# Patient Record
Sex: Male | Born: 1960 | Race: White | Hispanic: No | Marital: Married | Smoking: Never smoker
Health system: Southern US, Community
[De-identification: ages and names within clinical notes are randomized; demographics above are authoritative.]

## PROBLEM LIST (undated history)

## (undated) DIAGNOSIS — E785 Hyperlipidemia, unspecified: Secondary | ICD-10-CM

## (undated) DIAGNOSIS — K219 Gastro-esophageal reflux disease without esophagitis: Secondary | ICD-10-CM

## (undated) DIAGNOSIS — J301 Allergic rhinitis due to pollen: Secondary | ICD-10-CM

## (undated) DIAGNOSIS — N200 Calculus of kidney: Secondary | ICD-10-CM

## (undated) DIAGNOSIS — K589 Irritable bowel syndrome without diarrhea: Secondary | ICD-10-CM

## (undated) DIAGNOSIS — G43909 Migraine, unspecified, not intractable, without status migrainosus: Secondary | ICD-10-CM

## (undated) DIAGNOSIS — Z9289 Personal history of other medical treatment: Secondary | ICD-10-CM

## (undated) DIAGNOSIS — B019 Varicella without complication: Secondary | ICD-10-CM

## (undated) DIAGNOSIS — Z889 Allergy status to unspecified drugs, medicaments and biological substances status: Secondary | ICD-10-CM

## (undated) DIAGNOSIS — K5792 Diverticulitis of intestine, part unspecified, without perforation or abscess without bleeding: Secondary | ICD-10-CM

## (undated) DIAGNOSIS — I10 Essential (primary) hypertension: Secondary | ICD-10-CM

## (undated) HISTORY — DX: Varicella without complication: B01.9

## (undated) HISTORY — DX: Diverticulitis of intestine, part unspecified, without perforation or abscess without bleeding: K57.92

## (undated) HISTORY — DX: Allergic rhinitis due to pollen: J30.1

## (undated) HISTORY — DX: Allergy status to unspecified drugs, medicaments and biological substances: Z88.9

## (undated) HISTORY — DX: Personal history of other medical treatment: Z92.89

## (undated) HISTORY — DX: Migraine, unspecified, not intractable, without status migrainosus: G43.909

## (undated) HISTORY — DX: Hyperlipidemia, unspecified: E78.5

## (undated) HISTORY — DX: Gastro-esophageal reflux disease without esophagitis: K21.9

## (undated) HISTORY — DX: Calculus of kidney: N20.0

## (undated) HISTORY — PX: KIDNEY STONE SURGERY: SHX686

## (undated) HISTORY — DX: Essential (primary) hypertension: I10

## (undated) HISTORY — DX: Irritable bowel syndrome, unspecified: K58.9

---

## 2005-02-24 HISTORY — PX: SKIN GRAFT: SHX250

## 2005-02-24 HISTORY — PX: OTHER SURGICAL HISTORY: SHX169

## 2005-02-24 HISTORY — PX: ROTATOR CUFF REPAIR: SHX139

## 2010-04-05 ENCOUNTER — Ambulatory Visit (HOSPITAL_COMMUNITY)
Admission: RE | Admit: 2010-04-05 | Discharge: 2010-04-05 | Disposition: A | Discharge status: Home / Self Care | Payer: Self-pay | Source: Ambulatory Visit | Attending: Family Medicine | Admitting: Family Medicine

## 2010-04-05 ENCOUNTER — Other Ambulatory Visit (HOSPITAL_COMMUNITY): Payer: Self-pay | Admitting: Family Medicine

## 2010-04-05 DIAGNOSIS — M545 Low back pain, unspecified: Secondary | ICD-10-CM | POA: Insufficient documentation

## 2010-04-05 DIAGNOSIS — R52 Pain, unspecified: Secondary | ICD-10-CM

## 2010-04-05 DIAGNOSIS — M79609 Pain in unspecified limb: Secondary | ICD-10-CM | POA: Insufficient documentation

## 2013-03-07 ENCOUNTER — Ambulatory Visit (INDEPENDENT_AMBULATORY_CARE_PROVIDER_SITE_OTHER): Payer: Commercial Managed Care - PPO | Admitting: Physician Assistant

## 2013-03-07 ENCOUNTER — Encounter: Payer: Self-pay | Admitting: Physician Assistant

## 2013-03-07 VITALS — BP 110/74 | HR 98 | Temp 98.5°F | Resp 16 | Ht 67.5 in | Wt 185.0 lb

## 2013-03-07 DIAGNOSIS — K219 Gastro-esophageal reflux disease without esophagitis: Secondary | ICD-10-CM

## 2013-03-07 DIAGNOSIS — Z136 Encounter for screening for cardiovascular disorders: Secondary | ICD-10-CM

## 2013-03-07 DIAGNOSIS — R1013 Epigastric pain: Secondary | ICD-10-CM

## 2013-03-07 DIAGNOSIS — Z0000 Encounter for general adult medical examination without abnormal findings: Secondary | ICD-10-CM

## 2013-03-07 DIAGNOSIS — I10 Essential (primary) hypertension: Secondary | ICD-10-CM

## 2013-03-07 DIAGNOSIS — E785 Hyperlipidemia, unspecified: Secondary | ICD-10-CM

## 2013-03-07 MED ORDER — LISINOPRIL-HYDROCHLOROTHIAZIDE 20-12.5 MG PO TABS: 1.0000 | ORAL_TABLET | Freq: Every day | ORAL | Status: DC

## 2013-03-07 MED ORDER — PANTOPRAZOLE SODIUM 40 MG PO TBEC: 40.0000 mg | DELAYED_RELEASE_TABLET | Freq: Every day | ORAL | Status: DC

## 2013-03-07 MED ORDER — LOVASTATIN 20 MG PO TABS: 20.0000 mg | ORAL_TABLET | Freq: Every day | ORAL | Status: DC

## 2013-03-07 MED ORDER — PROPRANOLOL HCL 20 MG PO TABS: 20.0000 mg | ORAL_TABLET | Freq: Two times a day (BID) | ORAL | Status: DC

## 2013-03-07 NOTE — Progress Notes (Signed)
Patient presents to clinic today to establish care.  Acute Concerns: Patient needs refills of medications  Chronic Issues: Hypertension -- well-controlled with Lisinopril-HCTZ and propanolol.  Will need baseline EKG.  Does endorse a gradual worsening of far vision.  Denies acute vision change, palpitations, chest pain, shortness of breath, lightheadedness or dizziness.  Hyperlipidemia -- Currently on Pravachol.  Wishes to switch to a different statin due to insurance change and cost.  GERD -- Currently on Protonix daily.  Tries to watch his food intake. Endorses breakthrough symptoms, even with PPI therapy.  Will need referral to GI for further workup.  Hx of Ocular migraines -- infrequent.  Patient with symptom improvement since starting propanolol for HTN.  Health Maintenance: Dental -- UTD Vision -- Overdue Immunizations -- Declines flu shot.  Endorses being UTD for Tetanus. Colonoscopy -- Overdue.  Will need referral to GI.  Past Medical History  Diagnosis Date  . Chicken pox   . Diverticulitis   . GERD (gastroesophageal reflux disease)   . Hay fever   . H/O seasonal allergies   . Hypertension   . Hyperlipidemia   . Kidney stones   . Migraines   . History of blood transfusion     Past Surgical History  Procedure Laterality Date  . Degloving of arm  2007    Right [Post Injury]  . Skin graft  2007    Right Arm Axillary  . Rotator cuff repair  2007    Right    No current outpatient prescriptions on file prior to visit.   No current facility-administered medications on file prior to visit.    Allergies  Allergen Reactions  . Erythromycin Nausea And Vomiting  . Penicillins Other (See Comments)    Subtherapeutic  . Tramadol Nausea And Vomiting    Family History  Problem Relation Age of Onset  . Breast cancer Mother 5256    Deceased  . Heart disease Father 7370    Deceased  . Stroke Father   . Hypertension Father   . Diabetes Father   . Parkinson's disease  Paternal Grandfather   . Healthy Brother     x1  . Healthy Sister     x1    History   Social History  . Marital Status: Married    Spouse Name: N/A    Number of Children: N/A  . Years of Education: N/A   Occupational History  . Not on file.   Social History Main Topics  . Smoking status: Never Smoker   . Smokeless tobacco: Not on file  . Alcohol Use: No  . Drug Use: No  . Sexual Activity: Yes    Partners: Female   Other Topics Concern  . Not on file   Social History Narrative  . No narrative on file    Review of Systems  Constitutional: Negative for fever and weight loss.  HENT: Negative for ear pain, hearing loss and tinnitus.   Eyes: Positive for blurred vision. Negative for double vision, photophobia and pain.  Respiratory: Negative for cough, shortness of breath and wheezing.   Cardiovascular: Positive for palpitations. Negative for chest pain.  Gastrointestinal: Positive for heartburn and abdominal pain. Negative for nausea, vomiting, diarrhea, constipation, blood in stool and melena.  Genitourinary: Negative for dysuria, urgency, frequency, hematuria and flank pain.       Nocturia x 0  Musculoskeletal: Positive for joint pain.  Neurological: Positive for headaches. Negative for dizziness, seizures and loss of consciousness.  Endo/Heme/Allergies: Positive for  environmental allergies.  Psychiatric/Behavioral: Negative for depression, suicidal ideas, hallucinations and substance abuse. The patient is not nervous/anxious and does not have insomnia.    BP 110/74  Pulse 98  Temp(Src) 98.5 F (36.9 C) (Oral)  Resp 16  Ht 5' 7.5" (1.715 m)  Wt 185 lb (83.915 kg)  BMI 28.53 kg/m2  SpO2 98%  Physical Exam  Vitals reviewed. Constitutional: He is oriented to person, place, and time and well-developed, well-nourished, and in no distress.  HENT:  Head: Normocephalic and atraumatic.  Right Ear: External ear normal.  Left Ear: External ear normal.  Nose: Nose  normal.  Mouth/Throat: Oropharynx is clear and moist. No oropharyngeal exudate.  TM within normal limits bilaterally.  Eyes: Conjunctivae and EOM are normal. Pupils are equal, round, and reactive to light.  Neck: Neck supple. No thyromegaly present.  Cardiovascular: Normal rate, regular rhythm, normal heart sounds and intact distal pulses.   Pulmonary/Chest: Effort normal and breath sounds normal. No respiratory distress. He has no wheezes. He has no rales. He exhibits no tenderness.  Abdominal: Soft. Bowel sounds are normal. He exhibits no distension and no mass. There is no tenderness. There is no rebound and no guarding. No hernia.  Genitourinary: Testes/scrotum normal and penis normal. No discharge found.  Lymphadenopathy:    He has no cervical adenopathy.  Neurological: He is alert and oriented to person, place, and time. No cranial nerve deficit.  Skin: Skin is warm and dry. No rash noted.  Psychiatric: Affect normal.   Assessment/Plan: No problem-specific assessment & plan notes found for this encounter.

## 2013-03-07 NOTE — Progress Notes (Signed)
Pre visit review using our clinic review tool, if applicable. No additional management support is needed unless otherwise documented below in the visit note/SLS  

## 2013-03-07 NOTE — Patient Instructions (Signed)
Please take medications as prescribed.  Please return for labs. I will call you with your results.  You will be contacted by a Gastroenterology office for an appointment date/time.

## 2013-03-08 ENCOUNTER — Telehealth: Payer: Self-pay | Admitting: Physician Assistant

## 2013-03-08 DIAGNOSIS — Z0000 Encounter for general adult medical examination without abnormal findings: Secondary | ICD-10-CM | POA: Insufficient documentation

## 2013-03-08 DIAGNOSIS — I10 Essential (primary) hypertension: Secondary | ICD-10-CM | POA: Insufficient documentation

## 2013-03-08 DIAGNOSIS — K219 Gastro-esophageal reflux disease without esophagitis: Secondary | ICD-10-CM | POA: Insufficient documentation

## 2013-03-08 DIAGNOSIS — E785 Hyperlipidemia, unspecified: Secondary | ICD-10-CM | POA: Insufficient documentation

## 2013-03-08 DIAGNOSIS — R1013 Epigastric pain: Secondary | ICD-10-CM | POA: Insufficient documentation

## 2013-03-08 LAB — CBC WITH DIFFERENTIAL/PLATELET
Basophils Absolute: 0.1 10*3/uL (ref 0.0–0.1)
Basophils Relative: 1 % (ref 0–1)
Eosinophils Absolute: 0.2 10*3/uL (ref 0.0–0.7)
Eosinophils Relative: 4 % (ref 0–5)
HCT: 42.5 % (ref 39.0–52.0)
HEMOGLOBIN: 14.6 g/dL (ref 13.0–17.0)
LYMPHS ABS: 1.6 10*3/uL (ref 0.7–4.0)
Lymphocytes Relative: 28 % (ref 12–46)
MCH: 30.9 pg (ref 26.0–34.0)
MCHC: 34.4 g/dL (ref 30.0–36.0)
MCV: 90 fL (ref 78.0–100.0)
MONOS PCT: 9 % (ref 3–12)
Monocytes Absolute: 0.5 10*3/uL (ref 0.1–1.0)
NEUTROS ABS: 3.4 10*3/uL (ref 1.7–7.7)
NEUTROS PCT: 58 % (ref 43–77)
PLATELETS: 323 10*3/uL (ref 150–400)
RBC: 4.72 MIL/uL (ref 4.22–5.81)
RDW: 13.4 % (ref 11.5–15.5)
WBC: 5.7 10*3/uL (ref 4.0–10.5)

## 2013-03-08 LAB — LIPID PANEL
CHOL/HDL RATIO: 3.3 ratio
CHOLESTEROL: 174 mg/dL (ref 0–200)
HDL: 52 mg/dL (ref >39)
LDL Cholesterol: 105 mg/dL — ABNORMAL HIGH (ref 0–99)
Triglycerides: 87 mg/dL (ref <150)
VLDL: 17 mg/dL (ref 0–40)

## 2013-03-08 LAB — BASIC METABOLIC PANEL
BUN: 17 mg/dL (ref 6–23)
CO2: 28 meq/L (ref 19–32)
Calcium: 9.2 mg/dL (ref 8.4–10.5)
Chloride: 105 mEq/L (ref 96–112)
Creat: 0.94 mg/dL (ref 0.50–1.35)
GLUCOSE: 101 mg/dL — AB (ref 70–99)
POTASSIUM: 4.2 meq/L (ref 3.5–5.3)
SODIUM: 141 meq/L (ref 135–145)

## 2013-03-08 LAB — HEPATIC FUNCTION PANEL
ALBUMIN: 4.1 g/dL (ref 3.5–5.2)
ALK PHOS: 78 U/L (ref 39–117)
ALT: 25 U/L (ref 0–53)
AST: 21 U/L (ref 0–37)
BILIRUBIN INDIRECT: 0.6 mg/dL (ref 0.0–0.9)
BILIRUBIN TOTAL: 0.7 mg/dL (ref 0.3–1.2)
Bilirubin, Direct: 0.1 mg/dL (ref 0.0–0.3)
TOTAL PROTEIN: 6.5 g/dL (ref 6.0–8.3)

## 2013-03-08 LAB — LIPASE: Lipase: 47 U/L (ref 0–75)

## 2013-03-08 NOTE — Assessment & Plan Note (Signed)
History reviewed.  Health maintenance reviewed.  Declines flu shot.  Will obtain fasting labs.  Will obtain records from previous PCP.

## 2013-03-08 NOTE — Assessment & Plan Note (Signed)
Asymptomatic.  Baseline EKG with NSR.  Medications refilled.

## 2013-03-08 NOTE — Assessment & Plan Note (Signed)
Will obtain fasting lipid profile.  Will switch to Lovastatin for insurance purposes.

## 2013-03-08 NOTE — Assessment & Plan Note (Signed)
Refill Protonix.  Referral to GI due to chronic epigastric pain and refractory reflux symptoms.  Will obtain CMP and lipase.

## 2013-03-08 NOTE — Telephone Encounter (Signed)
Relevant patient education assigned to patient using Emmi. ° °

## 2013-03-09 LAB — URINALYSIS, ROUTINE W REFLEX MICROSCOPIC
Bilirubin Urine: NEGATIVE
GLUCOSE, UA: NEGATIVE mg/dL
KETONES UR: NEGATIVE mg/dL
Leukocytes, UA: NEGATIVE
NITRITE: NEGATIVE
PH: 6 (ref 5.0–8.0)
Protein, ur: NEGATIVE mg/dL
Urobilinogen, UA: 0.2 mg/dL (ref 0.0–1.0)

## 2013-03-09 LAB — URINALYSIS, MICROSCOPIC ONLY
BACTERIA UA: NONE SEEN
CASTS: NONE SEEN
CRYSTALS: NONE SEEN
Squamous Epithelial / LPF: NONE SEEN

## 2013-03-09 LAB — HEMOGLOBIN A1C
HEMOGLOBIN A1C: 5.7 % — AB (ref <5.7)
MEAN PLASMA GLUCOSE: 117 mg/dL — AB (ref <117)

## 2013-03-09 LAB — PSA: PSA: 0.53 ng/mL (ref <=4.00)

## 2013-03-09 LAB — TSH: TSH: 0.833 u[IU]/mL (ref 0.350–4.500)

## 2013-04-01 ENCOUNTER — Encounter: Payer: Self-pay | Admitting: Gastroenterology

## 2013-04-07 ENCOUNTER — Telehealth: Payer: Self-pay | Admitting: Physician Assistant

## 2013-04-07 NOTE — Telephone Encounter (Signed)
lovastatin (MEVACOR) 20 MG tablet 90 tablet 0 03/07/2013     Sig - Route: Take 1 tablet (20 mg total) by mouth at bedtime. - Oral    E-Prescribing Status: Receipt confirmed by pharmacy (03/07/2013 3:35 PM EST)    Associated Diagnoses    Hyperlipidemia    Pharmacy    [id number] - [place], [place] - [address]   TOO SOON FOR REFILL/sls

## 2013-04-07 NOTE — Telephone Encounter (Signed)
Lovastatin 21 mg  This was denied stating we did not have a patient by this name

## 2013-04-22 ENCOUNTER — Encounter: Payer: Self-pay | Admitting: Physician Assistant

## 2013-04-22 ENCOUNTER — Ambulatory Visit (INDEPENDENT_AMBULATORY_CARE_PROVIDER_SITE_OTHER): Payer: Commercial Managed Care - PPO | Admitting: Physician Assistant

## 2013-04-22 VITALS — BP 112/75 | HR 101 | Temp 99.2°F | Resp 16 | Ht 67.5 in | Wt 183.2 lb

## 2013-04-22 DIAGNOSIS — R059 Cough, unspecified: Secondary | ICD-10-CM

## 2013-04-22 DIAGNOSIS — J069 Acute upper respiratory infection, unspecified: Secondary | ICD-10-CM

## 2013-04-22 DIAGNOSIS — B9789 Other viral agents as the cause of diseases classified elsewhere: Principal | ICD-10-CM

## 2013-04-22 DIAGNOSIS — R509 Fever, unspecified: Secondary | ICD-10-CM

## 2013-04-22 DIAGNOSIS — J029 Acute pharyngitis, unspecified: Secondary | ICD-10-CM

## 2013-04-22 DIAGNOSIS — R05 Cough: Secondary | ICD-10-CM

## 2013-04-22 LAB — POCT RAPID STREP A (OFFICE): Rapid Strep A Screen: NEGATIVE

## 2013-04-22 MED ORDER — ONDANSETRON 8 MG PO TBDP: 8.0000 mg | ORAL_TABLET | Freq: Three times a day (TID) | ORAL | Status: DC | PRN

## 2013-04-22 MED ORDER — BENZONATATE 200 MG PO CAPS: 200.0000 mg | ORAL_CAPSULE | Freq: Three times a day (TID) | ORAL | Status: DC | PRN

## 2013-04-22 NOTE — Assessment & Plan Note (Signed)
Rapid strep negative.   Increase fluid intake.  Rest.  Saline nasal spray. Mucinex. Humidifier in bedroom. Tessalon Perles for cough.  Rx. zofran for nausea.  Please call or return to clinic if symptoms are not improving.

## 2013-04-22 NOTE — Addendum Note (Signed)
Addended by: Regis BillSCATES, SHARON L on: 04/22/2013 04:41 PM   Modules accepted: Orders

## 2013-04-22 NOTE — Patient Instructions (Signed)
Increase fluid intake.  Rest.  Saline nasal spray. Use plain Mucinex. Take Tessalon Perles as directed for cough. Place a humidifier in bedroom. Tylenol as needed for fever.  Please call or return to clinic if symptoms are not improved by Monday.    Viral Infections A virus is a type of germ. Viruses can cause:  Minor sore throats.  Aches and pains.  Headaches.  Runny nose.  Rashes.  Watery eyes.  Tiredness.  Coughs.  Loss of appetite.  Feeling sick to your stomach (nausea).  Throwing up (vomiting).  Watery poop (diarrhea). HOME CARE   Only take medicines as told by your doctor.  Drink enough water and fluids to keep your pee (urine) clear or pale yellow. Sports drinks are a good choice.  Get plenty of rest and eat healthy. Soups and broths with crackers or rice are fine. GET HELP RIGHT AWAY IF:   You have a very bad headache.  You have shortness of breath.  You have chest pain or neck pain.  You have an unusual rash.  You cannot stop throwing up.  You have watery poop that does not stop.  You cannot keep fluids down.  You or your child has a temperature by mouth above 102 F (38.9 C), not controlled by medicine.  Your baby is older than 3 months with a rectal temperature of 102 F (38.9 C) or higher.  Your baby is 133 months old or younger with a rectal temperature of 100.4 F (38 C) or higher. MAKE SURE YOU:   Understand these instructions.  Will watch this condition.  Will get help right away if you are not doing well or get worse. Document Released: 01/24/2008 Document Revised: 05/05/2011 Document Reviewed: 06/18/2010 [facility] Patient Information 2014 [place], [place].

## 2013-04-22 NOTE — Progress Notes (Signed)
Patient presents to clinic today c/o 2 days of nasal congestion, chest congestion and mild cough.  Patient endorses gradual onset of symptoms.  Patient states cough is productive of yellow sputum.  Endorses fever that began late last night. Fever ~ 101, reduced with Tylenol. Temperature is 99.2 in clinic today.  Has not taken Tylenol this am. Patient denies sinus pressure, sinus pain, ear pain, tooth pain, shortness of breath or wheezing.  Denies aches.  Denies recent travel or sick contact.  Denies history of asthma or allergy.  Cough is keeping patient up at night and is causing fatigue.  Denies pleuritic chest pain. O2 saturation at 100% on RA.  Past Medical History  Diagnosis Date  . Chicken pox   . Diverticulitis   . GERD (gastroesophageal reflux disease)   . Hay fever   . H/O seasonal allergies   . Hypertension   . Hyperlipidemia   . Kidney stones   . Migraines   . History of blood transfusion     Current Outpatient Prescriptions on File Prior to Visit  Medication Sig Dispense Refill  . lisinopril-hydrochlorothiazide (PRINZIDE,ZESTORETIC) 20-12.5 MG per tablet Take 1 tablet by mouth daily.  30 tablet  2  . lovastatin (MEVACOR) 20 MG tablet Take 1 tablet (20 mg total) by mouth at bedtime.  90 tablet  0  . pantoprazole (PROTONIX) 40 MG tablet Take 1 tablet (40 mg total) by mouth daily.  30 tablet  2  . propranolol (INDERAL) 20 MG tablet Take 1 tablet (20 mg total) by mouth 2 (two) times daily.  60 tablet  2   No current facility-administered medications on file prior to visit.    Allergies  Allergen Reactions  . Erythromycin Nausea And Vomiting  . Penicillins Other (See Comments)    Subtherapeutic  . Tramadol Nausea And Vomiting    Family History  Problem Relation Age of Onset  . Breast cancer Mother 3    Deceased  . Heart disease Father 20    Deceased  . Stroke Father   . Hypertension Father   . Diabetes Father   . Parkinson's disease Paternal Grandfather   . Healthy  Brother     x1  . Healthy Sister     x1    History   Social History  . Marital Status: Married    Spouse Name: N/A    Number of Children: N/A  . Years of Education: N/A   Social History Main Topics  . Smoking status: Never Smoker   . Smokeless tobacco: None  . Alcohol Use: No  . Drug Use: No  . Sexual Activity: Yes    Partners: Female   Other Topics Concern  . None   Social History Narrative  . None   Review of Systems - See HPI.  All other ROS are negative.  BP 112/75  Pulse 101  Temp(Src) 99.2 F (37.3 C) (Oral)  Resp 16  Ht 5' 7.5" (1.715 m)  Wt 183 lb 4 oz (83.122 kg)  BMI 28.26 kg/m2  SpO2 100%  Physical Exam  Vitals reviewed. Constitutional: He is oriented to person, place, and time and well-developed, well-nourished, and in no distress.  HENT:  Head: Normocephalic and atraumatic.  Right Ear: Tympanic membrane, external ear and ear canal normal.  Left Ear: Tympanic membrane, external ear and ear canal normal.  Nose: Nose normal.  Mouth/Throat: Uvula is midline and mucous membranes are normal. No uvula swelling. Posterior oropharyngeal erythema present. No oropharyngeal exudate, posterior oropharyngeal  edema or tonsillar abscesses.  Left tonsil 2+ without exudate .  Eyes: Conjunctivae are normal. Pupils are equal, round, and reactive to light.  Neck: Neck supple.  Cardiovascular: Regular rhythm, normal heart sounds and intact distal pulses.   Mildly tachycardic. Patient has not taken Inderal yet this am.  Pulmonary/Chest: Effort normal and breath sounds normal. No respiratory distress. He has no wheezes. He has no rales. He exhibits no tenderness.  Lymphadenopathy:    He has no cervical adenopathy.  Neurological: He is alert and oriented to person, place, and time.  Skin: Skin is warm. No rash noted.  Psychiatric: Affect normal.    Recent Results (from the past 2160 hour(s))  CBC WITH DIFFERENTIAL     Status: None   Collection Time    03/07/13  10:53 AM      Result Value Ref Range   WBC 5.7  4.0 - 10.5 K/uL   RBC 4.72  4.22 - 5.81 MIL/uL   Hemoglobin 14.6  13.0 - 17.0 g/dL   HCT 42.5  39.0 - 52.0 %   MCV 90.0  78.0 - 100.0 fL   MCH 30.9  26.0 - 34.0 pg   MCHC 34.4  30.0 - 36.0 g/dL   RDW 13.4  11.5 - 15.5 %   Platelets 323  150 - 400 K/uL   Neutrophils Relative % 58  43 - 77 %   Neutro Abs 3.4  1.7 - 7.7 K/uL   Lymphocytes Relative 28  12 - 46 %   Lymphs Abs 1.6  0.7 - 4.0 K/uL   Monocytes Relative 9  3 - 12 %   Monocytes Absolute 0.5  0.1 - 1.0 K/uL   Eosinophils Relative 4  0 - 5 %   Eosinophils Absolute 0.2  0.0 - 0.7 K/uL   Basophils Relative 1  0 - 1 %   Basophils Absolute 0.1  0.0 - 0.1 K/uL   Smear Review Criteria for review not met    BASIC METABOLIC PANEL     Status: Abnormal   Collection Time    03/07/13 10:53 AM      Result Value Ref Range   Sodium 141  135 - 145 mEq/L   Potassium 4.2  3.5 - 5.3 mEq/L   Chloride 105  96 - 112 mEq/L   CO2 28  19 - 32 mEq/L   Glucose, Bld 101 (*) 70 - 99 mg/dL   BUN 17  6 - 23 mg/dL   Creat 0.94  0.50 - 1.35 mg/dL   Calcium 9.2  8.4 - 10.5 mg/dL  HEPATIC FUNCTION PANEL     Status: None   Collection Time    03/07/13 10:53 AM      Result Value Ref Range   Total Bilirubin 0.7  0.3 - 1.2 mg/dL   Bilirubin, Direct 0.1  0.0 - 0.3 mg/dL   Indirect Bilirubin 0.6  0.0 - 0.9 mg/dL   Alkaline Phosphatase 78  39 - 117 U/L   AST 21  0 - 37 U/L   ALT 25  0 - 53 U/L   Total Protein 6.5  6.0 - 8.3 g/dL   Albumin 4.1  3.5 - 5.2 g/dL  TSH     Status: None   Collection Time    03/07/13 10:53 AM      Result Value Ref Range   TSH 0.833  0.350 - 4.500 uIU/mL  HEMOGLOBIN A1C     Status: Abnormal   Collection Time  03/07/13 10:53 AM      Result Value Ref Range   Hemoglobin A1C 5.7 (*) <5.7 %   Comment:                                                                            According to the ADA Clinical Practice Recommendations for 2011, when     HbA1c is used as a screening  test:             >=6.5%   Diagnostic of Diabetes Mellitus                (if abnormal result is confirmed)           5.7-6.4%   Increased risk of developing Diabetes Mellitus           References:Diagnosis and Classification of Diabetes Mellitus,Diabetes     ONGE,9528,41(LKGMW 1):S62-S69 and Standards of Medical Care in             Diabetes - 2011,Diabetes Care,2011,34 (Suppl 1):S11-S61.         Mean Plasma Glucose 117 (*) <117 mg/dL  LIPID PANEL     Status: Abnormal   Collection Time    03/07/13 10:53 AM      Result Value Ref Range   Cholesterol 174  0 - 200 mg/dL   Comment: ATP III Classification:           < 200        mg/dL        Desirable          200 - 239     mg/dL        Borderline High          >= 240        mg/dL        High         Triglycerides 87  <150 mg/dL   HDL 52  >39 mg/dL   Total CHOL/HDL Ratio 3.3     VLDL 17  0 - 40 mg/dL   LDL Cholesterol 105 (*) 0 - 99 mg/dL   Comment:       Total Cholesterol/HDL Ratio:CHD Risk                            Coronary Heart Disease Risk Table                                            Men       Women              1/2 Average Risk              3.4        3.3                  Average Risk              5.0        4.4               2X  Average Risk              9.6        7.1               3X Average Risk             23.4       11.0     Use the calculated Patient Ratio above and the CHD Risk table      to determine the patient's CHD Risk.     ATP III Classification (LDL):           < 100        mg/dL         Optimal          100 - 129     mg/dL         Near or Above Optimal          130 - 159     mg/dL         Borderline High          160 - 189     mg/dL         High           > 190        mg/dL         Very High        PSA     Status: None   Collection Time    03/07/13 10:53 AM      Result Value Ref Range   PSA 0.53  <=4.00 ng/mL   Comment: Test Methodology: ECLIA PSA (Electrochemiluminescence Immunoassay)            For PSA values from 2.5-4.0, particularly in younger men <60 years     old, the AUA and NCCN suggest testing for % Free PSA (3515) and     evaluation of the rate of increase in PSA (PSA velocity).  URINALYSIS, ROUTINE W REFLEX MICROSCOPIC     Status: Abnormal   Collection Time    03/07/13 10:53 AM      Result Value Ref Range   Color, Urine YELLOW  YELLOW   APPearance CLEAR  CLEAR   Specific Gravity, Urine <1.005 (*) 1.005 - 1.030   pH 6.0  5.0 - 8.0   Glucose, UA NEG  NEG mg/dL   Bilirubin Urine NEG  NEG   Ketones, ur NEG  NEG mg/dL   Hgb urine dipstick SMALL (*) NEG   Protein, ur NEG  NEG mg/dL   Urobilinogen, UA 0.2  0.0 - 1.0 mg/dL   Nitrite NEG  NEG   Leukocytes, UA NEG  NEG  LIPASE     Status: None   Collection Time    03/07/13 10:53 AM      Result Value Ref Range   Lipase 47  0 - 75 U/L  URINALYSIS, MICROSCOPIC ONLY     Status: None   Collection Time    03/07/13 10:53 AM      Result Value Ref Range   Squamous Epithelial / LPF NONE SEEN  RARE   Crystals NONE SEEN  NONE SEEN   Casts NONE SEEN  NONE SEEN   WBC, UA 0-2  <3 WBC/hpf   RBC / HPF 0-2  <3 RBC/hpf   Bacteria, UA NONE SEEN  RARE    Assessment/Plan: Viral URI with cough Rapid strep negative.   Increase fluid intake.  Rest.  Saline nasal spray.  Mucinex. Humidifier in bedroom. Tessalon Perles for cough.  Rx. zofran for nausea.  Please call or return to clinic if symptoms are not improving.

## 2013-04-22 NOTE — Progress Notes (Signed)
Pre visit review using our clinic review tool, if applicable. No additional management support is needed unless otherwise documented below in the visit note/SLS  

## 2013-05-03 ENCOUNTER — Ambulatory Visit (INDEPENDENT_AMBULATORY_CARE_PROVIDER_SITE_OTHER): Payer: Commercial Managed Care - PPO | Admitting: Gastroenterology

## 2013-05-03 ENCOUNTER — Encounter: Payer: Self-pay | Admitting: Gastroenterology

## 2013-05-03 VITALS — BP 90/60 | HR 96 | Ht 67.75 in | Wt 180.0 lb

## 2013-05-03 DIAGNOSIS — Z1211 Encounter for screening for malignant neoplasm of colon: Secondary | ICD-10-CM | POA: Insufficient documentation

## 2013-05-03 DIAGNOSIS — K219 Gastro-esophageal reflux disease without esophagitis: Secondary | ICD-10-CM

## 2013-05-03 NOTE — Progress Notes (Signed)
_                                                                                                                History of Present Illness: Pleasant 53 year old white male referred to establish care for chronic reflux.  He has had reflux for at least 25 years for which he has taken PPIs with good control.  He's currently taking Protonix.   He denies nausea, dysphagia or pyrosis.  He underwent a barium enema sometime in the 1990s.  He has no lower GI complaints including change of bowel habits, abdominal pain, melena or hematochezia.    Past Medical History  Diagnosis Date  . Chicken pox   . Diverticulitis   . GERD (gastroesophageal reflux disease)   . Hay fever   . H/O seasonal allergies   . Hypertension   . Hyperlipidemia   . Kidney stones   . Migraines   . History of blood transfusion   . IBS (irritable bowel syndrome)    Past Surgical History  Procedure Laterality Date  . Degloving of arm Right 2007     [Post Injury]  . Skin graft Right 2007    Arm Axillary  . Rotator cuff repair Right 2007  . Kidney stone surgery     family history includes Breast cancer (age of onset: 756) in his mother; Diabetes in his father; Healthy in his brother and sister; Heart disease (age of onset: 5670) in his father; Hypertension in his father; Irritable bowel syndrome in his brother and father; Other in his brother; Parkinson's disease in his paternal grandfather; Stroke in his father. Current Outpatient Prescriptions  Medication Sig Dispense Refill  . lisinopril-hydrochlorothiazide (PRINZIDE,ZESTORETIC) 20-12.5 MG per tablet Take 1 tablet by mouth daily.  30 tablet  2  . lovastatin (MEVACOR) 20 MG tablet Take 1 tablet (20 mg total) by mouth at bedtime.  90 tablet  0  . pantoprazole (PROTONIX) 40 MG tablet Take 1 tablet (40 mg total) by mouth daily.  30 tablet  2  . propranolol (INDERAL) 20 MG tablet Take 1 tablet (20 mg total) by mouth 2 (two) times daily.  60 tablet  2   No  current facility-administered medications for this visit.   Allergies as of 05/03/2013 - Review Complete 05/03/2013  Allergen Reaction Noted  . Erythromycin Nausea And Vomiting 03/07/2013  . Penicillins Other (See Comments) 03/07/2013  . Tramadol Nausea And Vomiting 03/07/2013    reports that he has never smoked. He has never used smokeless tobacco. He reports that he does not drink alcohol or use illicit drugs.     Review of Systems: Pertinent positive and negative review of systems were noted in the above HPI section. All other review of systems were otherwise negative.  Vital signs were reviewed in today's medical record Physical Exam: General: Well developed , well nourished, no acute distress Skin: anicteric Head: Normocephalic and atraumatic Eyes:  sclerae anicteric, EOMI Ears: Normal auditory acuity Mouth: No deformity or lesions Neck: Supple, no masses  or thyromegaly Lungs: Clear throughout to auscultation Heart: Regular rate and rhythm; no murmurs, rubs or bruits Abdomen: Soft, non tender and non distended. No masses, hepatosplenomegaly or hernias noted. Normal Bowel sounds Rectal:deferred Musculoskeletal: Symmetrical with no gross deformities  Skin: No lesions on visible extremities Pulses:  Normal pulses noted Extremities: No clubbing, cyanosis, edema or deformities noted Neurological: Alert oriented x 4, grossly nonfocal Cervical Nodes:  No significant cervical adenopathy Inguinal Nodes: No significant inguinal adenopathy Psychological:  Alert and cooperative. Normal mood and affect  See Assessment and Plan under Problem List

## 2013-05-03 NOTE — Assessment & Plan Note (Signed)
Patient has PPI--dependent GERD.  Since he's had symptoms and has been on therapy for over 25 years I think Barrett's esophagus should be ruled out.    Recommendations #1 upper endoscopy #2 continue Protonix

## 2013-05-03 NOTE — Patient Instructions (Signed)
It has been recommended to you by your physician that you have a(n) endoscopy and colonoscopy completed. Per your request, we did not schedule the procedure(s) today. Please contact our office at [phone] should you decide to have the procedure completed.

## 2013-05-03 NOTE — Assessment & Plan Note (Signed)
Recommend screening colonoscopy

## 2013-06-07 ENCOUNTER — Telehealth: Payer: Self-pay | Admitting: Physician Assistant

## 2013-06-07 DIAGNOSIS — I10 Essential (primary) hypertension: Secondary | ICD-10-CM

## 2013-06-07 DIAGNOSIS — E785 Hyperlipidemia, unspecified: Secondary | ICD-10-CM

## 2013-06-07 NOTE — Telephone Encounter (Signed)
Lovastatin 20 mg qty 90  Lisinopril 20/12.5 wants 90

## 2013-06-08 MED ORDER — LOVASTATIN 20 MG PO TABS: 20.0000 mg | ORAL_TABLET | Freq: Every day | ORAL | Status: DC

## 2013-06-08 MED ORDER — LISINOPRIL-HYDROCHLOROTHIAZIDE 20-12.5 MG PO TABS: 1.0000 | ORAL_TABLET | Freq: Every day | ORAL | Status: DC

## 2013-06-08 NOTE — Telephone Encounter (Signed)
Rx request to pharmacy/SLS  

## 2013-06-08 NOTE — Addendum Note (Signed)
Addended by: Regis BillSCATES, SHARON L on: 06/08/2013 11:27 AM   Modules accepted: Orders

## 2013-08-31 ENCOUNTER — Telehealth: Payer: Self-pay | Admitting: Physician Assistant

## 2013-08-31 DIAGNOSIS — I10 Essential (primary) hypertension: Secondary | ICD-10-CM

## 2013-08-31 DIAGNOSIS — K219 Gastro-esophageal reflux disease without esophagitis: Secondary | ICD-10-CM

## 2013-08-31 MED ORDER — LISINOPRIL-HYDROCHLOROTHIAZIDE 20-12.5 MG PO TABS: 1.0000 | ORAL_TABLET | Freq: Every day | ORAL | Status: DC

## 2013-08-31 MED ORDER — PANTOPRAZOLE SODIUM 40 MG PO TBEC: 40.0000 mg | DELAYED_RELEASE_TABLET | Freq: Every day | ORAL | Status: DC

## 2013-08-31 NOTE — Telephone Encounter (Signed)
Patient informed, understood/SLS 

## 2013-08-31 NOTE — Telephone Encounter (Signed)
New medication prescriptions sent to pharmacy.  Please inform patient.

## 2013-09-02 ENCOUNTER — Telehealth: Payer: Self-pay | Admitting: Physician Assistant

## 2013-09-02 DIAGNOSIS — K219 Gastro-esophageal reflux disease without esophagitis: Secondary | ICD-10-CM

## 2013-09-02 MED ORDER — PANTOPRAZOLE SODIUM 40 MG PO TBEC: 40.0000 mg | DELAYED_RELEASE_TABLET | Freq: Every day | ORAL | Status: DC

## 2013-09-02 NOTE — Telephone Encounter (Signed)
90 -day of Protonix sent to pharmacy.

## 2013-10-21 ENCOUNTER — Telehealth: Payer: Self-pay | Admitting: Physician Assistant

## 2013-10-21 DIAGNOSIS — I10 Essential (primary) hypertension: Secondary | ICD-10-CM

## 2013-10-21 DIAGNOSIS — E785 Hyperlipidemia, unspecified: Secondary | ICD-10-CM

## 2013-10-21 NOTE — Telephone Encounter (Signed)
Refill- propranolol  [facility] [address]

## 2013-10-25 MED ORDER — PROPRANOLOL HCL 20 MG PO TABS: 20.0000 mg | ORAL_TABLET | Freq: Two times a day (BID) | ORAL | Status: DC

## 2013-10-25 MED ORDER — LOVASTATIN 20 MG PO TABS: 20.0000 mg | ORAL_TABLET | Freq: Every day | ORAL | Status: DC

## 2013-10-25 NOTE — Telephone Encounter (Signed)
Caller name: Alvino Chapel Relation to pt: Call back number:  [phone] Pharmacy: [facility] [address]  Reason for call:  Pt's pharmacy states they have been trying to get his rx refilled. Pt is needing:  propranolol (INDERAL) 20 MG tablet  lovastatin (MEVACOR) 20 MG tablet  Please call when complete.

## 2013-10-25 NOTE — Telephone Encounter (Signed)
rx sent

## 2014-04-12 ENCOUNTER — Encounter: Payer: Self-pay | Admitting: Physician Assistant

## 2014-04-12 ENCOUNTER — Ambulatory Visit (INDEPENDENT_AMBULATORY_CARE_PROVIDER_SITE_OTHER): Payer: 59 | Admitting: Physician Assistant

## 2014-04-12 VITALS — BP 130/97 | HR 75 | Temp 98.4°F | Resp 16 | Ht 67.75 in | Wt 178.5 lb

## 2014-04-12 DIAGNOSIS — Z1211 Encounter for screening for malignant neoplasm of colon: Secondary | ICD-10-CM | POA: Insufficient documentation

## 2014-04-12 DIAGNOSIS — Z125 Encounter for screening for malignant neoplasm of prostate: Secondary | ICD-10-CM

## 2014-04-12 DIAGNOSIS — I10 Essential (primary) hypertension: Secondary | ICD-10-CM

## 2014-04-12 DIAGNOSIS — E785 Hyperlipidemia, unspecified: Secondary | ICD-10-CM

## 2014-04-12 DIAGNOSIS — K219 Gastro-esophageal reflux disease without esophagitis: Secondary | ICD-10-CM

## 2014-04-12 DIAGNOSIS — Z0000 Encounter for general adult medical examination without abnormal findings: Secondary | ICD-10-CM

## 2014-04-12 LAB — PSA: PSA: 1.83 ng/mL (ref 0.10–4.00)

## 2014-04-12 LAB — LIPID PANEL
CHOL/HDL RATIO: 4
Cholesterol: 168 mg/dL (ref 0–200)
HDL: 45.7 mg/dL (ref >39.00)
LDL Cholesterol: 96 mg/dL (ref 0–99)
NONHDL: 122.3
Triglycerides: 133 mg/dL (ref 0.0–149.0)
VLDL: 26.6 mg/dL (ref 0.0–40.0)

## 2014-04-12 LAB — URINALYSIS, ROUTINE W REFLEX MICROSCOPIC
BILIRUBIN URINE: NEGATIVE
KETONES UR: NEGATIVE
LEUKOCYTES UA: NEGATIVE
Nitrite: NEGATIVE
Specific Gravity, Urine: <=1.005 — AB (ref 1.000–1.030)
Total Protein, Urine: NEGATIVE
UROBILINOGEN UA: 0.2 (ref 0.0–1.0)
Urine Glucose: NEGATIVE
pH: 6.5 (ref 5.0–8.0)

## 2014-04-12 LAB — HEPATIC FUNCTION PANEL
ALBUMIN: 4.2 g/dL (ref 3.5–5.2)
ALT: 12 U/L (ref 0–53)
AST: 13 U/L (ref 0–37)
Alkaline Phosphatase: 79 U/L (ref 39–117)
BILIRUBIN TOTAL: 0.9 mg/dL (ref 0.2–1.2)
Bilirubin, Direct: 0.2 mg/dL (ref 0.0–0.3)
TOTAL PROTEIN: 6.6 g/dL (ref 6.0–8.3)

## 2014-04-12 LAB — BASIC METABOLIC PANEL
BUN: 16 mg/dL (ref 6–23)
CHLORIDE: 110 meq/L (ref 96–112)
CO2: 24 mEq/L (ref 19–32)
Calcium: 9.3 mg/dL (ref 8.4–10.5)
Creatinine, Ser: 0.86 mg/dL (ref 0.40–1.50)
GFR: 98.35 mL/min (ref >60.00)
Glucose, Bld: 101 mg/dL — ABNORMAL HIGH (ref 70–99)
POTASSIUM: 4.1 meq/L (ref 3.5–5.1)
SODIUM: 144 meq/L (ref 135–145)

## 2014-04-12 LAB — CBC
HEMATOCRIT: 42.2 % (ref 39.0–52.0)
Hemoglobin: 14.1 g/dL (ref 13.0–17.0)
MCHC: 33.6 g/dL (ref 30.0–36.0)
MCV: 91.3 fl (ref 78.0–100.0)
Platelets: 314 10*3/uL (ref 150.0–400.0)
RBC: 4.62 Mil/uL (ref 4.22–5.81)
RDW: 12.5 % (ref 11.5–15.5)
WBC: 8.4 10*3/uL (ref 4.0–10.5)

## 2014-04-12 LAB — TSH: TSH: 0.67 u[IU]/mL (ref 0.35–4.50)

## 2014-04-12 MED ORDER — METOPROLOL TARTRATE 25 MG PO TABS: 25.0000 mg | ORAL_TABLET | Freq: Two times a day (BID) | ORAL | Status: DC

## 2014-04-12 MED ORDER — LOVASTATIN 20 MG PO TABS: 20.0000 mg | ORAL_TABLET | Freq: Every day | ORAL | Status: AC

## 2014-04-12 MED ORDER — LISINOPRIL-HYDROCHLOROTHIAZIDE 20-12.5 MG PO TABS: 1.0000 | ORAL_TABLET | Freq: Every day | ORAL | Status: AC

## 2014-04-12 MED ORDER — PANTOPRAZOLE SODIUM 40 MG PO TBEC: 40.0000 mg | DELAYED_RELEASE_TABLET | Freq: Every day | ORAL | Status: AC

## 2014-04-12 NOTE — Assessment & Plan Note (Signed)
Well controlled. Continue current regimen. Medications refilled. 

## 2014-04-12 NOTE — Progress Notes (Signed)
Patient presents to clinic today for annual exam.  Patient is fasting for labs.  Acute Concerns: No acute concerns today.  Chronic Issues: Hypertension -- Patient endorses taking medications as directed.  Has been out of his prinzide for a few days.  Wishes to discuss switching Inderal for Metoprolol since his insurance formulary changed.  Patient denies chest pain, palpitations, lightheadedness, dizziness, vision changes or frequent headaches.  GERD -- Endorses well-controlled with current regimen.  Denies breakthrough symptoms.  Hyperlipidemia -- Endorses well-controlled on Mevacor.  Denies myalgias. Is fasting for labs.  Health Maintenance: Dental -- up-to-date Vision -- up-to-date Immunizations -- flu and tetanus up-to-date. Colonoscopy -- Overdue.  Will place referral.  Past Medical History  Diagnosis Date  . Chicken pox   . Diverticulitis   . GERD (gastroesophageal reflux disease)   . Hay fever   . H/O seasonal allergies   . Hypertension   . Hyperlipidemia   . Kidney stones   . Migraines   . History of blood transfusion   . IBS (irritable bowel syndrome)     Past Surgical History  Procedure Laterality Date  . Degloving of arm Right 2007     [Post Injury]  . Skin graft Right 2007    Arm Axillary  . Rotator cuff repair Right 2007  . Kidney stone surgery      No current outpatient prescriptions on file prior to visit.   No current facility-administered medications on file prior to visit.    Allergies  Allergen Reactions  . Erythromycin Nausea And Vomiting  . Penicillins Other (See Comments)    Subtherapeutic( in-effective)  . Tramadol Nausea And Vomiting    Family History  Problem Relation Age of Onset  . Breast cancer Mother [id number]    Deceased  . Heart disease Father 570    Deceased  . Stroke Father   . Hypertension Father   . Diabetes Father   . Parkinson's disease Paternal Grandfather   . Healthy Brother     x1  . Healthy Sister     x1  .  Irritable bowel syndrome Father   . Irritable bowel syndrome Brother   . Other Brother     septic shock    History   Social History  . Marital Status: Married    Spouse Name: N/A  . Number of Children: 0  . Years of Education: N/A   Occupational History  . security    Social History Main Topics  . Smoking status: Never Smoker   . Smokeless tobacco: Never Used  . Alcohol Use: No  . Drug Use: No  . Sexual Activity:    Partners: Female   Other Topics Concern  . Not on file   Social History Narrative   Review of Systems  Constitutional: Negative for fever and weight loss.  HENT: Negative for ear discharge, ear pain, hearing loss and tinnitus.   Eyes: Negative for blurred vision, double vision, photophobia and pain.  Respiratory: Negative for cough and shortness of breath.   Cardiovascular: Negative for chest pain and palpitations.  Gastrointestinal: Negative for heartburn, nausea, vomiting, abdominal pain, diarrhea, constipation, blood in stool and melena.  Genitourinary: Negative for dysuria, urgency, frequency, hematuria and flank pain.  Musculoskeletal: Negative for falls.  Neurological: Negative for dizziness, loss of consciousness and headaches.  Endo/Heme/Allergies: Negative for environmental allergies.  Psychiatric/Behavioral: Negative for depression, suicidal ideas, hallucinations and substance abuse. The patient is not nervous/anxious and does not have insomnia.  BP 130/97 mmHg  Pulse 75  Temp(Src) 98.4 F (36.9 C) (Oral)  Resp 16  Ht 5' 7.75" (1.721 m)  Wt 178 lb 8 oz (80.967 kg)  BMI 27.34 kg/m2  SpO2 100%  Physical Exam  Constitutional: He is oriented to person, place, and time and well-developed, well-nourished, and in no distress.  HENT:  Head: Normocephalic and atraumatic.  Right Ear: External ear normal.  Left Ear: External ear normal.  Nose: Nose normal.  Mouth/Throat: Oropharynx is clear and moist. No oropharyngeal exudate.  Eyes:  Conjunctivae and EOM are normal. Pupils are equal, round, and reactive to light.  Neck: Neck supple. No thyromegaly present.  Cardiovascular: Normal rate, regular rhythm, normal heart sounds and intact distal pulses.   Pulmonary/Chest: Effort normal and breath sounds normal. No respiratory distress. He has no wheezes. He has no rales. He exhibits no tenderness.  Abdominal: Soft. Bowel sounds are normal. He exhibits no distension and no mass. There is no tenderness. There is no rebound and no guarding.  Genitourinary: Testes/scrotum normal.  Patient defers exam.  Lymphadenopathy:    He has no cervical adenopathy.  Neurological: He is alert and oriented to person, place, and time.  Skin: Skin is warm and dry. No rash noted.  Psychiatric: Affect normal.  Vitals reviewed.  Assessment/Plan: Visit for preventive health examination I have reviewed the patient's medical history in detail and updated the computerized patient record. Immunizations are up-to-date. Referral to GI placed for screening colonoscopy. PHQ-9 Depression screen performed with score of 0. Preventive care discussed.  Handout given.  EKG reveals NSR.  Will obtain fasting labs today.    Hyperlipidemia Will obtain fasting lipid panel today.  Continue TLCS.   GERD (gastroesophageal reflux disease) Well-controlled.  Continue current regimen.  Medications refilled.   Hypertension Will discontinue Inderal and begin Metoprolol 25 mg BID.  Follow-up in 1 month for BP reassessment.

## 2014-04-12 NOTE — Patient Instructions (Signed)
Please continue medications as directed with the following changes -- I have stopped the Inderal and prescribed Metoprolol 25 mg to take twice daily.  Follow-up with me in 1 month for a blood pressure recheck.  Please stop by the lab for blood work.  I will call you with your results and we will tweak your medication regimen as indicated.  You will be contacted to set up a colonoscopy.  Preventive Care for Adults A healthy lifestyle and preventive care can promote health and wellness. Preventive health guidelines for men include the following key practices:  A routine yearly physical is a good way to check with your health care provider about your health and preventative screening. It is a chance to share any concerns and updates on your health and to receive a thorough exam.  Visit your dentist for a routine exam and preventative care every 6 months. Brush your teeth twice a day and floss once a day. Good oral hygiene prevents tooth decay and gum disease.  The frequency of eye exams is based on your age, health, family medical history, use of contact lenses, and other factors. Follow your health care provider's recommendations for frequency of eye exams.  Eat a healthy diet. Foods such as vegetables, fruits, whole grains, low-fat dairy products, and lean protein foods contain the nutrients you need without too many calories. Decrease your intake of foods high in solid fats, added sugars, and salt. Eat the right amount of calories for you.Get information about a proper diet from your health care provider, if necessary.  Regular physical exercise is one of the most important things you can do for your health. Most adults should get at least 150 minutes of moderate-intensity exercise (any activity that increases your heart rate and causes you to sweat) each week. In addition, most adults need muscle-strengthening exercises on 2 or more days a week.  Maintain a healthy weight. The body mass index  (BMI) is a screening tool to identify possible weight problems. It provides an estimate of body fat based on height and weight. Your health care provider can find your BMI and can help you achieve or maintain a healthy weight.For adults 20 years and older:  A BMI below 18.5 is considered underweight.  A BMI of 18.5 to 24.9 is normal.  A BMI of 25 to 29.9 is considered overweight.  A BMI of 30 and above is considered obese.  Maintain normal blood lipids and cholesterol levels by exercising and minimizing your intake of saturated fat. Eat a balanced diet with plenty of fruit and vegetables. Blood tests for lipids and cholesterol should begin at age 10 and be repeated every 5 years. If your lipid or cholesterol levels are high, you are over 50, or you are at high risk for heart disease, you may need your cholesterol levels checked more frequently.Ongoing high lipid and cholesterol levels should be treated with medicines if diet and exercise are not working.  If you smoke, find out from your health care provider how to quit. If you do not use tobacco, do not start.  Lung cancer screening is recommended for adults aged 57-80 years who are at high risk for developing lung cancer because of a history of smoking. A yearly low-dose CT scan of the lungs is recommended for people who have at least a 30-pack-year history of smoking and are a current smoker or have quit within the past 15 years. A pack year of smoking is smoking an average of 1  pack of cigarettes a day for 1 year (for example: 1 pack a day for 30 years or 2 packs a day for 15 years). Yearly screening should continue until the smoker has stopped smoking for at least 15 years. Yearly screening should be stopped for people who develop a health problem that would prevent them from having lung cancer treatment.  If you choose to drink alcohol, do not have more than 2 drinks per day. One drink is considered to be 12 ounces (355 mL) of beer, 5 ounces  (148 mL) of wine, or 1.5 ounces (44 mL) of liquor.  Avoid use of street drugs. Do not share needles with anyone. Ask for help if you need support or instructions about stopping the use of drugs.  High blood pressure causes heart disease and increases the risk of stroke. Your blood pressure should be checked at least every 1-2 years. Ongoing high blood pressure should be treated with medicines, if weight loss and exercise are not effective.  If you are 68-80 years old, ask your health care provider if you should take aspirin to prevent heart disease.  Diabetes screening involves taking a blood sample to check your fasting blood sugar level. This should be done once every 3 years, after age 61, if you are within normal weight and without risk factors for diabetes. Testing should be considered at a younger age or be carried out more frequently if you are overweight and have at least 1 risk factor for diabetes.  Colorectal cancer can be detected and often prevented. Most routine colorectal cancer screening begins at the age of 2 and continues through age 69. However, your health care provider may recommend screening at an earlier age if you have risk factors for colon cancer. On a yearly basis, your health care provider may provide home test kits to check for hidden blood in the stool. Use of a small camera at the end of a tube to directly examine the colon (sigmoidoscopy or colonoscopy) can detect the earliest forms of colorectal cancer. Talk to your health care provider about this at age 29, when routine screening begins. Direct exam of the colon should be repeated every 5-10 years through age 74, unless early forms of precancerous polyps or small growths are found.  People who are at an increased risk for hepatitis B should be screened for this virus. You are considered at high risk for hepatitis B if:  You were born in a country where hepatitis B occurs often. Talk with your health care provider about  which countries are considered high risk.  Your parents were born in a high-risk country and you have not received a shot to protect against hepatitis B (hepatitis B vaccine).  You have HIV or AIDS.  You use needles to inject street drugs.  You live with, or have sex with, someone who has hepatitis B.  You are a man who has sex with other men (MSM).  You get hemodialysis treatment.  You take certain medicines for conditions such as cancer, organ transplantation, and autoimmune conditions.  Hepatitis C blood testing is recommended for all people born from [birth year] through 1965 and any individual with known risks for hepatitis C.  Practice safe sex. Use condoms and avoid high-risk sexual practices to reduce the spread of sexually transmitted infections (STIs). STIs include gonorrhea, chlamydia, syphilis, trichomonas, herpes, HPV, and human immunodeficiency virus (HIV). Herpes, HIV, and HPV are viral illnesses that have no cure. They can result in disability, cancer,  and death.  If you are at risk of being infected with HIV, it is recommended that you take a prescription medicine daily to prevent HIV infection. This is called preexposure prophylaxis (PrEP). You are considered at risk if:  You are a man who has sex with other men (MSM) and have other risk factors.  You are a heterosexual man, are sexually active, and are at increased risk for HIV infection.  You take drugs by injection.  You are sexually active with a partner who has HIV.  Talk with your health care provider about whether you are at high risk of being infected with HIV. If you choose to begin PrEP, you should first be tested for HIV. You should then be tested every 3 months for as long as you are taking PrEP.  A one-time screening for abdominal aortic aneurysm (AAA) and surgical repair of large AAAs by ultrasound are recommended for men ages 83 to 57 years who are current or former smokers.  Healthy men should no longer  receive prostate-specific antigen (PSA) blood tests as part of routine cancer screening. Talk with your health care provider about prostate cancer screening.  Testicular cancer screening is not recommended for adult males who have no symptoms. Screening includes self-exam, a health care provider exam, and other screening tests. Consult with your health care provider about any symptoms you have or any concerns you have about testicular cancer.  Use sunscreen. Apply sunscreen liberally and repeatedly throughout the day. You should seek shade when your shadow is shorter than you. Protect yourself by wearing long sleeves, pants, a wide-brimmed hat, and sunglasses year round, whenever you are outdoors.  Once a month, do a whole-body skin exam, using a mirror to look at the skin on your back. Tell your health care provider about new moles, moles that have irregular borders, moles that are larger than a pencil eraser, or moles that have changed in shape or color.  Stay current with required vaccines (immunizations).  Influenza vaccine. All adults should be immunized every year.  Tetanus, diphtheria, and acellular pertussis (Td, Tdap) vaccine. An adult who has not previously received Tdap or who does not know his vaccine status should receive 1 dose of Tdap. This initial dose should be followed by tetanus and diphtheria toxoids (Td) booster doses every 10 years. Adults with an unknown or incomplete history of completing a 3-dose immunization series with Td-containing vaccines should begin or complete a primary immunization series including a Tdap dose. Adults should receive a Td booster every 10 years.  Varicella vaccine. An adult without evidence of immunity to varicella should receive 2 doses or a second dose if he has previously received 1 dose.  Human papillomavirus (HPV) vaccine. Males aged 7-21 years who have not received the vaccine previously should receive the 3-dose series. Males aged 22-26 years  may be immunized. Immunization is recommended through the age of 92 years for any male who has sex with males and did not get any or all doses earlier. Immunization is recommended for any person with an immunocompromised condition through the age of 5 years if he did not get any or all doses earlier. During the 3-dose series, the second dose should be obtained 4-8 weeks after the first dose. The third dose should be obtained 24 weeks after the first dose and 16 weeks after the second dose.  Zoster vaccine. One dose is recommended for adults aged 63 years or older unless certain conditions are present.  Measles, mumps,  and rubella (MMR) vaccine. Adults born before [birth year] generally are considered immune to measles and mumps. Adults born in [birth year] or later should have 1 or more doses of MMR vaccine unless there is a contraindication to the vaccine or there is laboratory evidence of immunity to each of the three diseases. A routine second dose of MMR vaccine should be obtained at least 28 days after the first dose for students attending postsecondary schools, health care workers, or international travelers. People who received inactivated measles vaccine or an unknown type of measles vaccine during 1963-1967 should receive 2 doses of MMR vaccine. People who received inactivated mumps vaccine or an unknown type of mumps vaccine before 1979 and are at high risk for mumps infection should consider immunization with 2 doses of MMR vaccine. Unvaccinated health care workers born before [birth year] who lack laboratory evidence of measles, mumps, or rubella immunity or laboratory confirmation of disease should consider measles and mumps immunization with 2 doses of MMR vaccine or rubella immunization with 1 dose of MMR vaccine.  Pneumococcal 13-valent conjugate (PCV13) vaccine. When indicated, a person who is uncertain of his immunization history and has no record of immunization should receive the PCV13 vaccine. An adult aged 52  years or older who has certain medical conditions and has not been previously immunized should receive 1 dose of PCV13 vaccine. This PCV13 should be followed with a dose of pneumococcal polysaccharide (PPSV23) vaccine. The PPSV23 vaccine dose should be obtained at least 8 weeks after the dose of PCV13 vaccine. An adult aged 31 years or older who has certain medical conditions and previously received 1 or more doses of PPSV23 vaccine should receive 1 dose of PCV13. The PCV13 vaccine dose should be obtained 1 or more years after the last PPSV23 vaccine dose.  Pneumococcal polysaccharide (PPSV23) vaccine. When PCV13 is also indicated, PCV13 should be obtained first. All adults aged 16 years and older should be immunized. An adult younger than age 25 years who has certain medical conditions should be immunized. Any person who resides in a nursing home or long-term care facility should be immunized. An adult smoker should be immunized. People with an immunocompromised condition and certain other conditions should receive both PCV13 and PPSV23 vaccines. People with human immunodeficiency virus (HIV) infection should be immunized as soon as possible after diagnosis. Immunization during chemotherapy or radiation therapy should be avoided. Routine use of PPSV23 vaccine is not recommended for American Indians, [place]atives, or people younger than 65 years unless there are medical conditions that require PPSV23 vaccine. When indicated, people who have unknown immunization and have no record of immunization should receive PPSV23 vaccine. One-time revaccination 5 years after the first dose of PPSV23 is recommended for people aged 19-64 years who have chronic kidney failure, nephrotic syndrome, asplenia, or immunocompromised conditions. People who received 1-2 doses of PPSV23 before age 101 years should receive another dose of PPSV23 vaccine at age 11 years or later if at least 5 years have passed since the previous dose.  Doses of PPSV23 are not needed for people immunized with PPSV23 at or after age 21 years.  Meningococcal vaccine. Adults with asplenia or persistent complement component deficiencies should receive 2 doses of quadrivalent meningococcal conjugate (MenACWY-D) vaccine. The doses should be obtained at least 2 months apart. Microbiologists working with certain meningococcal bacteria, [employer] recruits, people at risk during an outbreak, and people who travel to or live in countries with a high rate of meningitis should be immunized. A first-year  college student up through age 10 years who is living in a residence hall should receive a dose if he did not receive a dose on or after his 16th birthday. Adults who have certain high-risk conditions should receive one or more doses of vaccine.  Hepatitis A vaccine. Adults who wish to be protected from this disease, have certain high-risk conditions, work with hepatitis A-infected animals, work in hepatitis A research labs, or travel to or work in countries with a high rate of hepatitis A should be immunized. Adults who were previously unvaccinated and who anticipate close contact with an international adoptee during the first 60 days after arrival in the [place] from a country with a high rate of hepatitis A should be immunized.  Hepatitis B vaccine. Adults should be immunized if they wish to be protected from this disease, have certain high-risk conditions, may be exposed to blood or other infectious body fluids, are household contacts or sex partners of hepatitis B positive people, are clients or workers in certain care facilities, or travel to or work in countries with a high rate of hepatitis B.  Haemophilus influenzae type b (Hib) vaccine. A previously unvaccinated person with asplenia or sickle cell disease or having a scheduled splenectomy should receive 1 dose of Hib vaccine. Regardless of previous immunization, a recipient of a hematopoietic stem cell  transplant should receive a 3-dose series 6-12 months after his successful transplant. Hib vaccine is not recommended for adults with HIV infection. Preventive Service / Frequency Ages 62 to 41  Blood pressure check.** / Every 1 to 2 years.  Lipid and cholesterol check.** / Every 5 years beginning at age 1.  Hepatitis C blood test.** / For any individual with known risks for hepatitis C.  Skin self-exam. / Monthly.  Influenza vaccine. / Every year.  Tetanus, diphtheria, and acellular pertussis (Tdap, Td) vaccine.** / Consult your health care provider. 1 dose of Td every 10 years.  Varicella vaccine.** / Consult your health care provider.  HPV vaccine. / 3 doses over 6 months, if 3 or younger.  Measles, mumps, rubella (MMR) vaccine.** / You need at least 1 dose of MMR if you were born in 1957 or later. You may also need a second dose.  Pneumococcal 13-valent conjugate (PCV13) vaccine.** / Consult your health care provider.  Pneumococcal polysaccharide (PPSV23) vaccine.** / 1 to 2 doses if you smoke cigarettes or if you have certain conditions.  Meningococcal vaccine.** / 1 dose if you are age 34 to 60 years and a Market researcher living in a residence hall, or have one of several medical conditions. You may also need additional booster doses.  Hepatitis A vaccine.** / Consult your health care provider.  Hepatitis B vaccine.** / Consult your health care provider.  Haemophilus influenzae type b (Hib) vaccine.** / Consult your health care provider. Ages 5 to 26  Blood pressure check.** / Every 1 to 2 years.  Lipid and cholesterol check.** / Every 5 years beginning at age 27.  Lung cancer screening. / Every year if you are aged 35-80 years and have a 30-pack-year history of smoking and currently smoke or have quit within the past 15 years. Yearly screening is stopped once you have quit smoking for at least 15 years or develop a health problem that would prevent you from  having lung cancer treatment.  Fecal occult blood test (FOBT) of stool. / Every year beginning at age 69 and continuing until age 39. You may not have  to do this test if you get a colonoscopy every 10 years.  Flexible sigmoidoscopy** or colonoscopy.** / Every 5 years for a flexible sigmoidoscopy or every 10 years for a colonoscopy beginning at age 26 and continuing until age 68.  Hepatitis C blood test.** / For all people born from [birth year] through 1966 and any individual with known risks for hepatitis C.  Skin self-exam. / Monthly.  Influenza vaccine. / Every year.  Tetanus, diphtheria, and acellular pertussis (Tdap/Td) vaccine.** / Consult your health care provider. 1 dose of Td every 10 years.  Varicella vaccine.** / Consult your health care provider.  Zoster vaccine.** / 1 dose for adults aged 79 years or older.  Measles, mumps, rubella (MMR) vaccine.** / You need at least 1 dose of MMR if you were born in 1958 or later. You may also need a second dose.  Pneumococcal 13-valent conjugate (PCV13) vaccine.** / Consult your health care provider.  Pneumococcal polysaccharide (PPSV23) vaccine.** / 1 to 2 doses if you smoke cigarettes or if you have certain conditions.  Meningococcal vaccine.** / Consult your health care provider.  Hepatitis A vaccine.** / Consult your health care provider.  Hepatitis B vaccine.** / Consult your health care provider.  Haemophilus influenzae type b (Hib) vaccine.** / Consult your health care provider. Ages 42 and over  Blood pressure check.** / Every 1 to 2 years.  Lipid and cholesterol check.**/ Every 5 years beginning at age 53.  Lung cancer screening. / Every year if you are aged 64-80 years and have a 30-pack-year history of smoking and currently smoke or have quit within the past 15 years. Yearly screening is stopped once you have quit smoking for at least 15 years or develop a health problem that would prevent you from having lung cancer  treatment.  Fecal occult blood test (FOBT) of stool. / Every year beginning at age 92 and continuing until age 81. You may not have to do this test if you get a colonoscopy every 10 years.  Flexible sigmoidoscopy** or colonoscopy.** / Every 5 years for a flexible sigmoidoscopy or every 10 years for a colonoscopy beginning at age 11 and continuing until age 73.  Hepatitis C blood test.** / For all people born from [birth year] through 1966 and any individual with known risks for hepatitis C.  Abdominal aortic aneurysm (AAA) screening.** / A one-time screening for ages 66 to 47 years who are current or former smokers.  Skin self-exam. / Monthly.  Influenza vaccine. / Every year.  Tetanus, diphtheria, and acellular pertussis (Tdap/Td) vaccine.** / 1 dose of Td every 10 years.  Varicella vaccine.** / Consult your health care provider.  Zoster vaccine.** / 1 dose for adults aged 29 years or older.  Pneumococcal 13-valent conjugate (PCV13) vaccine.** / Consult your health care provider.  Pneumococcal polysaccharide (PPSV23) vaccine.** / 1 dose for all adults aged 27 years and older.  Meningococcal vaccine.** / Consult your health care provider.  Hepatitis A vaccine.** / Consult your health care provider.  Hepatitis B vaccine.** / Consult your health care provider.  Haemophilus influenzae type b (Hib) vaccine.** / Consult your health care provider. **Family history and personal history of risk and conditions may change your health care provider's recommendations. Document Released: 04/08/2001 Document Revised: 02/15/2013 Document Reviewed: 07/08/2010 [facility] Patient Information 2015 [place], [place]. This information is not intended to replace advice given to you by your health care provider. Make sure you discuss any questions you have with your health care provider.

## 2014-04-12 NOTE — Assessment & Plan Note (Signed)
Will obtain fasting lipid panel today.  Continue TLCS.

## 2014-04-12 NOTE — Assessment & Plan Note (Signed)
Will discontinue Inderal and begin Metoprolol 25 mg BID.  Follow-up in 1 month for BP reassessment.

## 2014-04-12 NOTE — Progress Notes (Signed)
Pre visit review using our clinic review tool, if applicable. No additional management support is needed unless otherwise documented below in the visit note/SLS  

## 2014-04-12 NOTE — Assessment & Plan Note (Signed)
I have reviewed the patient's medical history in detail and updated the computerized patient record. Immunizations are up-to-date. Referral to GI placed for screening colonoscopy. PHQ-9 Depression screen performed with score of 0. Preventive care discussed.  Handout given.  EKG reveals NSR.  Will obtain fasting labs today.

## 2014-05-10 ENCOUNTER — Ambulatory Visit: Payer: Self-pay | Admitting: Physician Assistant

## 2014-05-11 ENCOUNTER — Telehealth: Payer: Self-pay | Admitting: Physician Assistant

## 2014-05-11 NOTE — Telephone Encounter (Signed)
Charge. 

## 2014-05-11 NOTE — Telephone Encounter (Signed)
PT was no show for appt on 3/16- rescheduled for 3/18. Charge no show? °

## 2014-05-12 ENCOUNTER — Ambulatory Visit (INDEPENDENT_AMBULATORY_CARE_PROVIDER_SITE_OTHER): Payer: 59 | Admitting: Physician Assistant

## 2014-05-12 ENCOUNTER — Encounter: Payer: Self-pay | Admitting: Physician Assistant

## 2014-05-12 VITALS — BP 118/85 | HR 91 | Temp 98.2°F | Wt 172.0 lb

## 2014-05-12 DIAGNOSIS — H6980 Other specified disorders of Eustachian tube, unspecified ear: Secondary | ICD-10-CM | POA: Insufficient documentation

## 2014-05-12 DIAGNOSIS — I10 Essential (primary) hypertension: Secondary | ICD-10-CM

## 2014-05-12 DIAGNOSIS — H6982 Other specified disorders of Eustachian tube, left ear: Secondary | ICD-10-CM

## 2014-05-12 MED ORDER — METOPROLOL TARTRATE 25 MG PO TABS: 25.0000 mg | ORAL_TABLET | Freq: Two times a day (BID) | ORAL | Status: AC

## 2014-05-12 MED ORDER — FLUTICASONE PROPIONATE 50 MCG/ACT NA SUSP: 2.0000 | Freq: Every day | NASAL | Status: DC

## 2014-05-12 NOTE — Progress Notes (Signed)
Pre visit review using our clinic review tool, if applicable. No additional management support is needed unless otherwise documented below in the visit note. 

## 2014-05-12 NOTE — Progress Notes (Signed)
Patient presents to clinic today for 5479-month follow-up of hypertension after adjusting his medication regimen.  Patient currently on Prinzide 20-12.5 mg and Lopressor 25 mg BID.  Endorses taking medications as directed. Patient denies chest pain, palpitations, lightheadedness, dizziness, vision changes or frequent headaches.  Patient complains of waking up this morning with L ear pain. Denies ringing, drainage.  Endorses some pressure and mild rhinorrhea.  Endorses + hx of seasonal allergies. Denies fever, chills or malaise.  Past Medical History  Diagnosis Date  . Chicken pox   . Diverticulitis   . GERD (gastroesophageal reflux disease)   . Hay fever   . H/O seasonal allergies   . Hypertension   . Hyperlipidemia   . Kidney stones   . Migraines   . History of blood transfusion   . IBS (irritable bowel syndrome)     Current Outpatient Prescriptions on File Prior to Visit  Medication Sig Dispense Refill  . lisinopril-hydrochlorothiazide (PRINZIDE,ZESTORETIC) 20-12.5 MG per tablet Take 1 tablet by mouth daily. 90 tablet 3  . lovastatin (MEVACOR) 20 MG tablet Take 1 tablet (20 mg total) by mouth at bedtime. 90 tablet 3  . pantoprazole (PROTONIX) 40 MG tablet Take 1 tablet (40 mg total) by mouth daily. 90 tablet 2   No current facility-administered medications on file prior to visit.    Allergies  Allergen Reactions  . Erythromycin Nausea And Vomiting  . Penicillins Other (See Comments)    Subtherapeutic( in-effective)  . Tramadol Nausea And Vomiting    Family History  Problem Relation Age of Onset  . Breast cancer Mother 7556    Deceased  . Heart disease Father 4770    Deceased  . Stroke Father   . Hypertension Father   . Diabetes Father   . Parkinson's disease Paternal Grandfather   . Healthy Brother     x1  . Healthy Sister     x1  . Irritable bowel syndrome Father   . Irritable bowel syndrome Brother   . Other Brother     septic shock    History   Social  History  . Marital Status: Married    Spouse Name: N/A  . Number of Children: 0  . Years of Education: N/A   Occupational History  . security    Social History Main Topics  . Smoking status: Never Smoker   . Smokeless tobacco: Never Used  . Alcohol Use: No  . Drug Use: No  . Sexual Activity:    Partners: Female   Other Topics Concern  . None   Social History Narrative   Review of Systems - See HPI.  All other ROS are negative.  BP 118/85 mmHg  Pulse 91  Temp(Src) 98.2 F (36.8 C)  Wt 172 lb (78.019 kg)  SpO2 100%  Physical Exam  Constitutional: He is oriented to person, place, and time and well-developed, well-nourished, and in no distress.  HENT:  Head: Normocephalic and atraumatic.  Right Ear: External ear normal.  Left Ear: External ear normal.  Nose: Nose normal.  Mouth/Throat: Oropharynx is clear and moist. No oropharyngeal exudate.  L TM with air bubbling noted.  Cardiovascular: Normal rate, regular rhythm, normal heart sounds and intact distal pulses.   Pulmonary/Chest: Effort normal and breath sounds normal. No respiratory distress. He has no wheezes. He has no rales. He exhibits no tenderness.  Neurological: He is alert and oriented to person, place, and time.  Skin: Skin is warm and dry. No rash noted.  Psychiatric: Affect normal.  Vitals reviewed.   Recent Results (from the past 2160 hour(s))  CBC     Status: None   Collection Time: 04/12/14 10:11 AM  Result Value Ref Range   WBC 8.4 4.0 - 10.5 K/uL   RBC 4.62 4.22 - 5.81 Mil/uL   Platelets 314.0 150.0 - 400.0 K/uL   Hemoglobin 14.1 13.0 - 17.0 g/dL   HCT 40.9 81.1 - 91.4 %   MCV 91.3 78.0 - 100.0 fl   MCHC 33.6 30.0 - 36.0 g/dL   RDW 78.2 95.6 - 21.3 %  Basic metabolic panel     Status: Abnormal   Collection Time: 04/12/14 10:11 AM  Result Value Ref Range   Sodium 144 135 - 145 mEq/L   Potassium 4.1 3.5 - 5.1 mEq/L   Chloride 110 96 - 112 mEq/L   CO2 24 19 - 32 mEq/L   Glucose, Bld 101  (H) 70 - 99 mg/dL   BUN 16 6 - 23 mg/dL   Creatinine, Ser 0.86 0.40 - 1.50 mg/dL   Calcium 9.3 8.4 - 57.8 mg/dL   GFR 46.96 >29.52 mL/min  Hepatic function panel     Status: None   Collection Time: 04/12/14 10:11 AM  Result Value Ref Range   Total Bilirubin 0.9 0.2 - 1.2 mg/dL   Bilirubin, Direct 0.2 0.0 - 0.3 mg/dL   Alkaline Phosphatase 79 39 - 117 U/L   AST 13 0 - 37 U/L   ALT 12 0 - 53 U/L   Total Protein 6.6 6.0 - 8.3 g/dL   Albumin 4.2 3.5 - 5.2 g/dL  TSH     Status: None   Collection Time: 04/12/14 10:11 AM  Result Value Ref Range   TSH 0.67 0.35 - 4.50 uIU/mL  Urinalysis, Routine w reflex microscopic     Status: Abnormal   Collection Time: 04/12/14 10:11 AM  Result Value Ref Range   Color, Urine YELLOW Yellow;Lt. Yellow   APPearance CLEAR Clear   Specific Gravity, Urine <=1.005 (A) 1.000 - 1.030   pH 6.5 5.0 - 8.0   Total Protein, Urine NEGATIVE Negative   Urine Glucose NEGATIVE Negative   Ketones, ur NEGATIVE Negative   Bilirubin Urine NEGATIVE Negative   Hgb urine dipstick SMALL (A) Negative   Urobilinogen, UA 0.2 0.0 - 1.0   Leukocytes, UA NEGATIVE Negative   Nitrite NEGATIVE Negative   WBC, UA 0-2/hpf 0-2/hpf   RBC / HPF 0-2/hpf 0-2/hpf  PSA     Status: None   Collection Time: 04/12/14 10:11 AM  Result Value Ref Range   PSA 1.83 0.10 - 4.00 ng/mL  Lipid Profile     Status: None   Collection Time: 04/12/14 10:11 AM  Result Value Ref Range   Cholesterol 168 0 - 200 mg/dL    Comment: ATP III Classification       Desirable:  < 200 mg/dL               Borderline High:  200 - 239 mg/dL          High:  > = 841 mg/dL   Triglycerides 324.4 0.0 - 149.0 mg/dL    Comment: Normal:  <010 mg/dLBorderline High:  150 - 199 mg/dL   HDL 27.25 >36.64 mg/dL   VLDL 40.3 0.0 - 47.4 mg/dL   LDL Cholesterol 96 0 - 99 mg/dL   Total CHOL/HDL Ratio 4     Comment:  Men          Women1/2 Average Risk     3.4          3.3Average Risk          5.0          4.42X Average  Risk          9.6          7.13X Average Risk          15.0          11.0                       NonHDL 122.30     Comment: NOTE:  Non-HDL goal should be 30 mg/dL higher than patient's LDL goal (i.e. LDL goal of < 70 mg/dL, would have non-HDL goal of < 100 mg/dL)    Assessment/Plan: Hypertension Well-controlled.  Continue current regimen. Medications refilled.   Eustachian tube dysfunction Mild.  Rx Flonase.  Supportive measures discussed.  Follow-up PRN.

## 2014-05-12 NOTE — Assessment & Plan Note (Signed)
Mild.  Rx Flonase.  Supportive measures discussed.  Follow-up PRN.

## 2014-05-12 NOTE — Patient Instructions (Signed)
You blood pressure is still looking great.   Please continue the current regimen.  For the ear pain, this seems to be coming from fluid buildup.  Please use the Flonase as directed.  Follow-up if symptoms are not improving.

## 2014-05-12 NOTE — Assessment & Plan Note (Signed)
Well controlled. Continue current regimen. Medications refilled. 

## 2014-09-05 ENCOUNTER — Encounter: Payer: Self-pay | Admitting: Physician Assistant

## 2014-09-05 ENCOUNTER — Ambulatory Visit (INDEPENDENT_AMBULATORY_CARE_PROVIDER_SITE_OTHER): Payer: 59 | Admitting: Physician Assistant

## 2014-09-05 VITALS — BP 125/83 | HR 71 | Temp 98.0°F | Ht 67.75 in | Wt 171.8 lb

## 2014-09-05 DIAGNOSIS — M2669 Other specified disorders of temporomandibular joint: Secondary | ICD-10-CM | POA: Insufficient documentation

## 2014-09-05 NOTE — Progress Notes (Signed)
Patient presents to clinic today c/o left jaw popping in an out of joint while eating x 3 days. Denies pain except when popping occurs. Has not had to manually replace joint. Denies trauma or injury. Has prior history of TMJ dysfunction not causing issue in many years.  Past Medical History  Diagnosis Date  . Chicken pox   . Diverticulitis   . GERD (gastroesophageal reflux disease)   . Hay fever   . H/O seasonal allergies   . Hypertension   . Hyperlipidemia   . Kidney stones   . Migraines   . History of blood transfusion   . IBS (irritable bowel syndrome)     Current Outpatient Prescriptions on File Prior to Visit  Medication Sig Dispense Refill  . fluticasone (FLONASE) 50 MCG/ACT nasal spray Place 2 sprays into both nostrils daily. 16 g 0  . gabapentin (NEURONTIN) 600 MG tablet   1  . HYDROcodone-acetaminophen (NORCO/VICODIN) 5-325 MG per tablet   0  . lisinopril-hydrochlorothiazide (PRINZIDE,ZESTORETIC) 20-12.5 MG per tablet Take 1 tablet by mouth daily. 90 tablet 3  . lovastatin (MEVACOR) 20 MG tablet Take 1 tablet (20 mg total) by mouth at bedtime. 90 tablet 3  . methocarbamol (ROBAXIN) 750 MG tablet   5  . metoprolol tartrate (LOPRESSOR) 25 MG tablet Take 1 tablet (25 mg total) by mouth 2 (two) times daily. 180 tablet 2  . pantoprazole (PROTONIX) 40 MG tablet Take 1 tablet (40 mg total) by mouth daily. 90 tablet 2  . SUMAtriptan (IMITREX) 100 MG tablet   0   No current facility-administered medications on file prior to visit.    Allergies  Allergen Reactions  . Erythromycin Nausea And Vomiting  . Penicillins Other (See Comments)    Subtherapeutic( in-effective)  . Tramadol Nausea And Vomiting    Family History  Problem Relation Age of Onset  . Breast cancer Mother 95    Deceased  . Heart disease Father 19    Deceased  . Stroke Father   . Hypertension Father   . Diabetes Father   . Parkinson's disease Paternal Grandfather   . Healthy Brother     x1  .  Healthy Sister     x1  . Irritable bowel syndrome Father   . Irritable bowel syndrome Brother   . Other Brother     septic shock    History   Social History  . Marital Status: Married    Spouse Name: N/A  . Number of Children: 0  . Years of Education: N/A   Occupational History  . security    Social History Main Topics  . Smoking status: Never Smoker   . Smokeless tobacco: Never Used  . Alcohol Use: No  . Drug Use: No  . Sexual Activity:    Partners: Female   Other Topics Concern  . None   Social History Narrative   Review of Systems - See HPI.  All other ROS are negative.  BP 125/83 mmHg  Pulse 71  Temp(Src) 98 F (36.7 C) (Oral)  Ht 5' 7.75" (1.721 m)  Wt 171 lb 12.8 oz (77.928 kg)  BMI 26.31 kg/m2  SpO2 99%  Physical Exam  Constitutional: He is oriented to person, place, and time and well-developed, well-nourished, and in no distress.  HENT:  Head: Normocephalic and atraumatic.  Right Ear: External ear normal.  Left Ear: External ear normal.  Nose: Nose normal.  Mouth/Throat: Oropharynx is clear and moist. No oropharyngeal exudate.  TMJ joints  with good ROM without pain.  Eyes: Conjunctivae are normal.  Neck: Neck supple.  Cardiovascular: Normal rate, regular rhythm, normal heart sounds and intact distal pulses.   Pulmonary/Chest: Effort normal and breath sounds normal. No respiratory distress. He has no wheezes. He has no rales. He exhibits no tenderness.  Neurological: He is alert and oriented to person, place, and time.  Skin: Skin is warm and dry. No rash noted.  Vitals reviewed.   No results found for this or any previous visit (from the past 2160 hour(s)).  Assessment/Plan: TMJ hypermobility Supportive and dietary measures discussed. Pain relief discussed although no pain presently. Patient to see dentist tomorrow for further assessment and management. If not improving, will need MRI TMJ joint.

## 2014-09-05 NOTE — Progress Notes (Signed)
Pre visit review using our clinic review tool, if applicable. No additional management support is needed unless otherwise documented below in the visit note. 

## 2014-09-05 NOTE — Patient Instructions (Signed)
Please sip liquids through straw and eat small bites of softer foods. Avoid chewing on affected side if possible.  This will help to tighten the ligaments. Discuss symptoms further with your dentist tomorrow at appointment. If not improving we will have to get a CT/MRI of the TMJ joint to further assess.

## 2014-09-05 NOTE — Assessment & Plan Note (Signed)
Supportive and dietary measures discussed. Pain relief discussed although no pain presently. Patient to see dentist tomorrow for further assessment and management. If not improving, will need MRI TMJ joint.

## 2014-09-07 ENCOUNTER — Telehealth: Payer: Self-pay | Admitting: Physician Assistant

## 2014-09-07 NOTE — Telephone Encounter (Signed)
Relation to pt: self  Call back number: [phone]   Reason for call:  Pt was advised to follow up with PCP in regards to dentist appointment. Dentist advised no big burgers, smaller bites alternate between moist heat and ice. Basically what you advised as per patient.

## 2014-09-11 NOTE — Telephone Encounter (Signed)
Ok thank you for making me aware.

## 2014-09-22 ENCOUNTER — Ambulatory Visit (INDEPENDENT_AMBULATORY_CARE_PROVIDER_SITE_OTHER): Payer: 59 | Admitting: Physician Assistant

## 2014-09-22 ENCOUNTER — Encounter: Payer: Self-pay | Admitting: Physician Assistant

## 2014-09-22 VITALS — BP 100/70 | HR 83 | Temp 98.2°F | Ht 67.75 in | Wt 171.0 lb

## 2014-09-22 DIAGNOSIS — M109 Gout, unspecified: Secondary | ICD-10-CM | POA: Diagnosis not present

## 2014-09-22 DIAGNOSIS — Z0182 Encounter for allergy testing: Secondary | ICD-10-CM

## 2014-09-22 DIAGNOSIS — M654 Radial styloid tenosynovitis [de Quervain]: Secondary | ICD-10-CM

## 2014-09-22 LAB — URIC ACID: URIC ACID, SERUM: 7.4 mg/dL (ref 4.0–7.8)

## 2014-09-22 MED ORDER — MELOXICAM 15 MG PO TABS: 15.0000 mg | ORAL_TABLET | Freq: Every day | ORAL | Status: AC

## 2014-09-22 NOTE — Assessment & Plan Note (Signed)
Flare resolved but suspect pain is related to secondary arthritis. Rx Mobic for hand will also help with toe pain. Will check uric acid level and begin gout prophylaxis as indicated.

## 2014-09-22 NOTE — Assessment & Plan Note (Signed)
Order placed

## 2014-09-22 NOTE — Patient Instructions (Signed)
Please go to the lab for blood work. I will call you with your results. Please take Meloxicam daily with food as directed. Use tylenol if needed for breakthrough pain. Ice the wrist/thumb and wear the splint daily over the next week to 2 weeks.  The pain in your toe seems due to recurrent attacks of gout. We will check uric acid level today to confirm this. Please continue to limit trigger foods. The meloxicam will also help with pain from arthritic changes to the toe. If uric acid level is high we will begin a medication to prevent gout attacks.  De Quervain's Tenosynovitis De Quervain's tenosynovitis involves inflammation of one or two tendon linings (sheaths) or strain of one or two tendons to the thumb: extensor pollicis brevis (EPB), or abductor pollicis longus (APL). This causes pain on the side of the wrist and base of the thumb. Tendon sheaths secrete a fluid that lubricates the tendon, allowing the tendon to move smoothly. When the sheath becomes inflamed, the tendon cannot move freely in the sheath. Both the EPB and APL tendons are important for proper use of the hand. The EPB tendon is important for straightening the thumb. The APL tendon is important for moving the thumb away from the index finger (abducting). The two tendons pass through a small tube (canal) in the wrist, near the base of the thumb. When the tendons become inflamed, pain is usually felt in this area. SYMPTOMS   Pain, tenderness, swelling, warmth, or redness over the base of the thumb and thumb side of the wrist.  Pain that gets worse when straightening the thumb.  Pain that gets worse when moving the thumb away from the index finger, against resistance.  Pain with pinching or gripping.  Locking or catching of the thumb.  Limited motion of the thumb.  Crackling sound (crepitation) when the tendon or thumb is moved or touched.  Fluid-filled cyst in the area of the base of the thumb. CAUSES   Tenosynovitis is  often linked with overuse of the wrist.  Tenosynovitis may be caused by repeated injury to the thumb muscle and tendon units, and with repeated motions of the hand and wrist, due to friction of the tendon within the lining (sheath).  Tenosynovitis may also be due to a sudden increase in activity or change in activity. RISK INCREASES WITH:  Sports that involve repeated hand and wrist motions (golf, bowling, tennis, squash, racquetball).  Heavy labor.  Poor physical wrist strength and flexibility.  Failure to warm up properly before practice or play.  Male gender.  New mothers who hold their baby's head for long periods or lift infants with thumbs in the infant's armpit (axilla). PREVENTION  Warm up and stretch properly before practice or competition.  Allow enough time for rest and recovery between practices and competition.  Maintain appropriate conditioning:  Cardiovascular fitness.  Forearm, wrist, and hand flexibility.  Muscle strength and endurance.  Use proper exercise technique. PROGNOSIS  This condition is usually curable within 6 weeks, if treated properly with non-surgical treatment and resting of the affected area.  RELATED COMPLICATIONS   Longer healing time if not properly treated or if not given enough time to heal.  Chronic inflammation, causing recurring symptoms of tenosynovitis. Permanent pain or restriction of movement.  Risks of surgery: infection, bleeding, injury to nerves (numbness of the thumb), continued pain, incomplete release of the tendon sheath, recurring symptoms, cutting of the tendons, tendons sliding out of position, weakness of the thumb, thumb  stiffness. TREATMENT  First, treatment involves the use of medicine and ice, to reduce pain and inflammation. Patients are encouraged to stop or modify activities that aggravate the injury. Stretching and strengthening exercises may be advised. Exercises may be completed at home or with a  therapist. You may be fitted with a brace or splint, to limit motion and allow the injury to heal. Your caregiver may also choose to give you a corticosteroid injection, to reduce the pain and inflammation. If non-surgical treatment is not successful, surgery may be needed. Most tenosynovitis surgeries are done as outpatient procedures (you go home the same day). Surgery may involve local, regional (whole arm), or general anesthesia.  MEDICATION   If pain medicine is needed, nonsteroidal anti-inflammatory medicines (aspirin and ibuprofen), or other minor pain relievers (acetaminophen), are often advised.  Do not take pain medicine for 7 days before surgery.  Prescription pain relievers are often prescribed only after surgery. Use only as directed and only as much as you need.  Corticosteroid injections may be given if your caregiver thinks they are needed. There is a limited number of times these injections may be given. COLD THERAPY   Cold treatment (icing) should be applied for 10 to 15 minutes every 2 to 3 hours for inflammation and pain, and immediately after activity that aggravates your symptoms. Use ice packs or an ice massage. SEEK MEDICAL CARE IF:   Symptoms get worse or do not improve in 2 to 4 weeks, despite treatment.  You experience pain, numbness, or coldness in the hand.  Blue, gray, or dark color appears in the fingernails.  Any of the following occur after surgery: increased pain, swelling, redness, drainage of fluids, bleeding in the affected area, or signs of infection.  New, unexplained symptoms develop. (Drugs used in treatment may produce side effects.) Document Released: 02/10/2005 Document Revised: 05/05/2011 Document Reviewed: 05/25/2008 [facility] Patient Information 2015 [place], [place]. This information is not intended to replace advice given to you by your health care provider. Make sure you discuss any questions you have with your health care provider.

## 2014-09-22 NOTE — Progress Notes (Signed)
Patient presents to clinic today c/o R great toe pain, warmth and swelling intermittently over the past month. Denies redness or warmth to joint presently. Is left with dull and aching pain. Denies trauma or injury.  Patient also c/o left hand pain more focused on lateral aspect of R thumb and wrist x 3 days. Does a lot of twisting and turning of objects with hands at work. Denies trauma or injury. Has not taken anything for symptoms.  Patient requesting food allergy testing.  Past Medical History  Diagnosis Date  . Chicken pox   . Diverticulitis   . GERD (gastroesophageal reflux disease)   . Hay fever   . H/O seasonal allergies   . Hypertension   . Hyperlipidemia   . Kidney stones   . Migraines   . History of blood transfusion   . IBS (irritable bowel syndrome)     Current Outpatient Prescriptions on File Prior to Visit  Medication Sig Dispense Refill  . gabapentin (NEURONTIN) 600 MG tablet   1  . HYDROcodone-acetaminophen (NORCO/VICODIN) 5-325 MG per tablet   0  . lisinopril-hydrochlorothiazide (PRINZIDE,ZESTORETIC) 20-12.5 MG per tablet Take 1 tablet by mouth daily. 90 tablet 3  . lovastatin (MEVACOR) 20 MG tablet Take 1 tablet (20 mg total) by mouth at bedtime. 90 tablet 3  . methocarbamol (ROBAXIN) 750 MG tablet   5  . metoprolol tartrate (LOPRESSOR) 25 MG tablet Take 1 tablet (25 mg total) by mouth 2 (two) times daily. 180 tablet 2  . pantoprazole (PROTONIX) 40 MG tablet Take 1 tablet (40 mg total) by mouth daily. 90 tablet 2  . SUMAtriptan (IMITREX) 100 MG tablet   0   No current facility-administered medications on file prior to visit.    Allergies  Allergen Reactions  . Erythromycin Nausea And Vomiting  . Penicillins Other (See Comments)    Subtherapeutic( in-effective)  . Tramadol Nausea And Vomiting    Family History  Problem Relation Age of Onset  . Breast cancer Mother 85    Deceased  . Heart disease Father 25    Deceased  . Stroke Father   .  Hypertension Father   . Diabetes Father   . Parkinson's disease Paternal Grandfather   . Healthy Brother     x1  . Healthy Sister     x1  . Irritable bowel syndrome Father   . Irritable bowel syndrome Brother   . Other Brother     septic shock    History   Social History  . Marital Status: Married    Spouse Name: N/A  . Number of Children: 0  . Years of Education: N/A   Occupational History  . security    Social History Main Topics  . Smoking status: Never Smoker   . Smokeless tobacco: Never Used  . Alcohol Use: No  . Drug Use: No  . Sexual Activity:    Partners: Female   Other Topics Concern  . None   Social History Narrative   Review of Systems - See HPI.  All other ROS are negative.  BP 100/70 mmHg  Pulse 83  Temp(Src) 98.2 F (36.8 C) (Oral)  Ht 5' 7.75" (1.721 m)  Wt 171 lb (77.565 kg)  BMI 26.19 kg/m2  SpO2 98%  Physical Exam  Constitutional: He is oriented to person, place, and time and well-developed, well-nourished, and in no distress.  HENT:  Head: Normocephalic and atraumatic.  Eyes: Conjunctivae are normal.  Neck: Neck supple.  Cardiovascular: Normal  rate, regular rhythm, normal heart sounds and intact distal pulses.   Pulmonary/Chest: Effort normal and breath sounds normal. No respiratory distress. He has no wheezes. He has no rales. He exhibits no tenderness.  Musculoskeletal:       Left hand: He exhibits normal range of motion and normal capillary refill. Normal sensation noted. Normal strength noted.       Right foot: There is normal range of motion, no tenderness, no bony tenderness, no swelling and normal capillary refill.  + Finklestein test  Neurological: He is alert and oriented to person, place, and time.  Skin: Skin is warm and dry. No rash noted.  Psychiatric: Affect normal.  Vitals reviewed.  Assessment/Plan: Encounter for allergy testing Order placed.  Gout Flare resolved but suspect pain is related to secondary  arthritis. Rx Mobic for hand will also help with toe pain. Will check uric acid level and begin gout prophylaxis as indicated.  De Quervain's tenosynovitis, left Splint given in office. Wear daily x 1-2 weeks. Rx Mobic. Ice and elevation recommended. Follow-up 2 weeks if not resolved.

## 2014-09-22 NOTE — Assessment & Plan Note (Signed)
Splint given in office. Wear daily x 1-2 weeks. Rx Mobic. Ice and elevation recommended. Follow-up 2 weeks if not resolved.

## 2014-09-22 NOTE — Progress Notes (Signed)
Pre visit review using our clinic review tool, if applicable. No additional management support is needed unless otherwise documented below in the visit note. 

## 2014-09-25 LAB — ALLERGY FULL AND FOOD SPECIFIC PROFILE
Allergen, D pternoyssinus,d7: <0.1 kU/L
Alternaria Alternata: <0.1 kU/L
Apple: <0.1 kU/L
Bahia Grass: <0.1 kU/L
Bermuda Grass: <0.1 kU/L
Box Elder IgE: <0.1 kU/L
Candida Albicans: <0.1 kU/L
Cat Dander: <0.1 kU/L
Curvularia lunata: <0.1 kU/L
D. farinae: <0.1 kU/L
Dog Dander: <0.1 kU/L
Egg White IgE: <0.1 kU/L
Elm IgE: <0.1 kU/L
Fish Cod: <0.1 kU/L
G005 Rye, Perennial: <0.1 kU/L
House Dust Hollister: <0.1 kU/L
IgE (Immunoglobulin E), Serum: 7 kU/L (ref <115)
Lamb's Quarters: <0.1 kU/L
Milk IgE: <0.1 kU/L
Oak: <0.1 kU/L
Peanut IgE: <0.1 kU/L
Plantain: <0.1 kU/L
Shrimp IgE: <0.1 kU/L
Soybean IgE: <0.1 kU/L
Sycamore Tree: <0.1 kU/L
Tuna IgE: <0.1 kU/L

## 2014-09-28 ENCOUNTER — Encounter: Payer: Self-pay | Admitting: General Practice

## 2014-11-28 ENCOUNTER — Encounter: Payer: Self-pay | Admitting: Physician Assistant

## 2014-11-28 ENCOUNTER — Ambulatory Visit (INDEPENDENT_AMBULATORY_CARE_PROVIDER_SITE_OTHER): Payer: 59 | Admitting: Physician Assistant

## 2014-11-28 VITALS — BP 106/74 | HR 90 | Temp 98.0°F | Resp 16 | Ht 67.75 in | Wt 169.0 lb

## 2014-11-28 DIAGNOSIS — H6092 Unspecified otitis externa, left ear: Secondary | ICD-10-CM | POA: Diagnosis not present

## 2014-11-28 DIAGNOSIS — H60509 Unspecified acute noninfective otitis externa, unspecified ear: Secondary | ICD-10-CM | POA: Insufficient documentation

## 2014-11-28 MED ORDER — CIPROFLOXACIN-DEXAMETHASONE 0.3-0.1 % OT SUSP: OTIC | Status: DC

## 2014-11-28 NOTE — Progress Notes (Signed)
Patient presents to clinic today c/o L ear pain x 2 days. Denies drainage from ear, change in hearing or tinnitus. Endorses ear tender to touch. Denies fever, chills, malaise. Denies recent swimming. Denies symptoms affecting R ear.  Past Medical History  Diagnosis Date  . Chicken pox   . Diverticulitis   . GERD (gastroesophageal reflux disease)   . Hay fever   . H/O seasonal allergies   . Hypertension   . Hyperlipidemia   . Kidney stones   . Migraines   . History of blood transfusion   . IBS (irritable bowel syndrome)     Current Outpatient Prescriptions on File Prior to Visit  Medication Sig Dispense Refill  . gabapentin (NEURONTIN) 600 MG tablet Take 600 mg by mouth daily as needed.   1  . HYDROcodone-acetaminophen (NORCO/VICODIN) 5-325 MG per tablet Take 1 tablet by mouth daily as needed.   0  . lisinopril-hydrochlorothiazide (PRINZIDE,ZESTORETIC) 20-12.5 MG per tablet Take 1 tablet by mouth daily. 90 tablet 3  . lovastatin (MEVACOR) 20 MG tablet Take 1 tablet (20 mg total) by mouth at bedtime. 90 tablet 3  . meloxicam (MOBIC) 15 MG tablet Take 1 tablet (15 mg total) by mouth daily. 30 tablet 0  . methocarbamol (ROBAXIN) 750 MG tablet Take 750 mg by mouth daily as needed.   5  . metoprolol tartrate (LOPRESSOR) 25 MG tablet Take 1 tablet (25 mg total) by mouth 2 (two) times daily. 180 tablet 2  . pantoprazole (PROTONIX) 40 MG tablet Take 1 tablet (40 mg total) by mouth daily. 90 tablet 2  . SUMAtriptan (IMITREX) 100 MG tablet Take 100 mg by mouth as needed.   0   No current facility-administered medications on file prior to visit.    Allergies  Allergen Reactions  . Erythromycin Nausea And Vomiting  . Penicillins Other (See Comments)    Subtherapeutic( in-effective)  . Tramadol Nausea And Vomiting    Family History  Problem Relation Age of Onset  . Breast cancer Mother 46    Deceased  . Heart disease Father 68    Deceased  . Stroke Father   . Hypertension  Father   . Diabetes Father   . Parkinson's disease Paternal Grandfather   . Healthy Brother     x1  . Healthy Sister     x1  . Irritable bowel syndrome Father   . Irritable bowel syndrome Brother   . Other Brother     septic shock    Social History   Social History  . Marital Status: Married    Spouse Name: N/A  . Number of Children: 0  . Years of Education: N/A   Occupational History  . security    Social History Main Topics  . Smoking status: Never Smoker   . Smokeless tobacco: Never Used  . Alcohol Use: No  . Drug Use: No  . Sexual Activity:    Partners: Female   Other Topics Concern  . None   Social History Narrative   Review of Systems - See HPI.  All other ROS are negative.  BP 106/74 mmHg  Pulse 90  Temp(Src) 98 F (36.7 C) (Oral)  Resp 16  Ht 5' 7.75" (1.721 m)  Wt 169 lb (76.658 kg)  BMI 25.88 kg/m2  SpO2 98%  Physical Exam  Constitutional: He is oriented to person, place, and time and well-developed, well-nourished, and in no distress.  HENT:  Head: Normocephalic and atraumatic.  Right Ear: Tympanic  membrane normal.  Left Ear: There is swelling.  Eyes: Conjunctivae are normal.  Cardiovascular: Normal rate, regular rhythm, normal heart sounds and intact distal pulses.   Pulmonary/Chest: Effort normal and breath sounds normal. No respiratory distress. He has no wheezes. He has no rales. He exhibits no tenderness.  Neurological: He is alert and oriented to person, place, and time.  Skin: Skin is warm and dry. No rash noted.  Psychiatric: Affect normal.  Vitals reviewed.   Recent Results (from the past 2160 hour(s))  Uric acid     Status: None   Collection Time: 09/22/14 10:03 AM  Result Value Ref Range   Uric Acid, Serum 7.4 4.0 - 7.8 mg/dL  Allergy Full and Food Specific Profile     Status: None   Collection Time: 09/22/14 10:19 AM  Result Value Ref Range   Allergen, D pternoyssinus,d7 <0.10 kU/L   D. farinae <0.10 kU/L   Egg White IgE  <0.10 kU/L   Milk IgE <0.10 kU/L   Fish Cod <0.10 kU/L   Wheat IgE <0.10 kU/L   Peanut IgE <0.10 kU/L   Soybean IgE <0.10 kU/L   Corn <0.10 kU/L   Tomato IgE <0.10 kU/L   Orange <0.10 kU/L   Apple <0.10 kU/L   Chicken IgE <0.10 kU/L   Shrimp IgE <0.10 kU/L   Tuna IgE <0.10 kU/L   Cat Dander <0.10 kU/L   Dog Dander <0.10 kU/L   Goose Feathers <0.10 kU/L   [place] Grass <0.10 kU/L   Fescue <0.10 kU/L   G005 Rye, Perennial <0.10 kU/L   Timothy Grass <0.10 kU/L   G009 Red Top <0.10 kU/L   [place] Grass <0.10 kU/L   Sara Lee <0.10 kU/L   Aspergillus fumigatus, m3 <0.10 kU/L   Alternaria Alternata <0.10 kU/L   Helminthosporium halodes <0.10 kU/L   Stemphylium Botryosum <0.10 kU/L   Candida Albicans <0.10 kU/L   Curvularia lunata <0.10 kU/L   [place] IgE <0.10 kU/L   Oak <0.10 kU/L   Elm IgE <0.10 kU/L   Sycamore Tree <0.10 kU/L   Common Ragweed <0.10 kU/L   Plantain <0.10 kU/L   Lamb's Quarters <0.10 kU/L   Goldenrod <0.10 kU/L   IgE (Immunoglobulin E), Serum 7 <115 kU/L    Comment:   Footnotes:  (1)        ----------------------------------------------------      Class   Specific IgE (kU/L)   Level      ----------------------------------------------------      0       <0.10                 Absent or undetectable      0/1     0.10  -   0.34        Equivocal/Borderline      1       0.35  -   0.69        Low      2       0.70  -   3.49        Moderate      3       3.50  -  17.49        High      4       17.50 -  49.99        Very High      5       50.00 - 100.00  Very High      6       >100.00               Very High     Assessment/Plan: Acute otitis externa Rx Ciprodex. Supportive measures reviewed. Follow-up if symptoms are not resolving.

## 2014-11-28 NOTE — Patient Instructions (Signed)
Please use antibiotic as directed. Wear cotton in the ears when showering to prevent water, bacteria getting in the ear. Call or return to clinic if symptoms are not resolving.

## 2014-11-28 NOTE — Progress Notes (Signed)
Pre visit review using our clinic review tool, if applicable. No additional management support is needed unless otherwise documented below in the visit note/SLS  

## 2014-11-28 NOTE — Assessment & Plan Note (Signed)
Rx Ciprodex. Supportive measures reviewed. Follow-up if symptoms are not resolving.

## 2015-02-02 ENCOUNTER — Telehealth: Payer: Self-pay | Admitting: Physician Assistant

## 2015-02-02 NOTE — Telephone Encounter (Signed)
HM updated

## 2015-02-02 NOTE — Telephone Encounter (Signed)
Pt states he does not do flu shots.

## 2017-11-21 ENCOUNTER — Emergency Department (HOSPITAL_COMMUNITY)
Admission: EM | Admit: 2017-11-21 | Discharge: 2017-11-21 | Disposition: A | Discharge status: Home / Self Care | Attending: Emergency Medicine | Admitting: Emergency Medicine

## 2017-11-21 ENCOUNTER — Encounter (HOSPITAL_COMMUNITY): Payer: Self-pay | Admitting: Emergency Medicine

## 2017-11-21 ENCOUNTER — Emergency Department (HOSPITAL_COMMUNITY)

## 2017-11-21 DIAGNOSIS — I10 Essential (primary) hypertension: Secondary | ICD-10-CM | POA: Insufficient documentation

## 2017-11-21 DIAGNOSIS — S0990XA Unspecified injury of head, initial encounter: Secondary | ICD-10-CM

## 2017-11-21 DIAGNOSIS — W228XXA Striking against or struck by other objects, initial encounter: Secondary | ICD-10-CM | POA: Insufficient documentation

## 2017-11-21 DIAGNOSIS — Z79899 Other long term (current) drug therapy: Secondary | ICD-10-CM | POA: Insufficient documentation

## 2017-11-21 DIAGNOSIS — W108XXA Fall (on) (from) other stairs and steps, initial encounter: Secondary | ICD-10-CM | POA: Insufficient documentation

## 2017-11-21 DIAGNOSIS — S0181XA Laceration without foreign body of other part of head, initial encounter: Secondary | ICD-10-CM | POA: Diagnosis not present

## 2017-11-21 DIAGNOSIS — Y9389 Activity, other specified: Secondary | ICD-10-CM | POA: Insufficient documentation

## 2017-11-21 DIAGNOSIS — Y990 Civilian activity done for income or pay: Secondary | ICD-10-CM | POA: Diagnosis not present

## 2017-11-21 DIAGNOSIS — Y92214 College as the place of occurrence of the external cause: Secondary | ICD-10-CM | POA: Insufficient documentation

## 2017-11-21 LAB — I-STAT CHEM 8, ED
BUN: 17 mg/dL (ref 6–20)
CALCIUM ION: 1.19 mmol/L (ref 1.15–1.40)
Chloride: 101 mmol/L (ref 98–111)
Creatinine, Ser: 1.2 mg/dL (ref 0.61–1.24)
Glucose, Bld: 113 mg/dL — ABNORMAL HIGH (ref 70–99)
HCT: 40 % (ref 39.0–52.0)
Hemoglobin: 13.6 g/dL (ref 13.0–17.0)
Potassium: 3.5 mmol/L (ref 3.5–5.1)
SODIUM: 141 mmol/L (ref 135–145)
TCO2: 29 mmol/L (ref 22–32)

## 2017-11-21 LAB — CBG MONITORING, ED: Glucose-Capillary: 114 mg/dL — ABNORMAL HIGH (ref 70–99)

## 2017-11-21 MED ORDER — HYDROCODONE-ACETAMINOPHEN 5-325 MG PO TABS
1.0000 | ORAL_TABLET | Freq: Once | ORAL | Status: AC
  Administered 2017-11-21: 1 via ORAL
  Filled 2017-11-21: qty 1

## 2017-11-21 MED ORDER — ACETAMINOPHEN 500 MG PO TABS
1000.0000 mg | ORAL_TABLET | Freq: Once | ORAL | Status: AC
  Administered 2017-11-21: 1000 mg via ORAL
  Filled 2017-11-21: qty 2

## 2017-11-21 MED ORDER — LIDOCAINE-EPINEPHRINE (PF) 2 %-1:200000 IJ SOLN
20.0000 mL | Freq: Once | INTRAMUSCULAR | Status: AC
  Administered 2017-11-21: 20 mL
  Filled 2017-11-21: qty 20

## 2017-11-21 NOTE — ED Triage Notes (Signed)
Pt transported from [employer] (employer). Pt transported post fall, per witnesses pt fell while walking with LOC. Wound noted to L forehead, bleeding controlled.  Repetitive questioning. Denies neck/back pain.  No Thinners

## 2017-11-21 NOTE — Discharge Instructions (Addendum)
Suture removal in 5 days

## 2017-11-21 NOTE — ED Provider Notes (Signed)
[facility] EMERGENCY DEPARTMENT Provider Note   CSN: [id number] Arrival date & time: 11/21/17  4098     History   Chief Complaint Chief Complaint  Patient presents with  . Fall    HPI Aaron Macias is a 57 y.o. male.  HPI Patient is a 57 year old male presents the emergency department after falling down and striking his left forehead resulting in laceration to his left forehead with bleeding at the scene but no bleeding at this time.  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  A call came out.  He quickly ran out the back door and down the steps and felt slightly lightheaded and passed out striking his right forehead.  Apparently he had repetitive questioning at scene.  Niece of anticoagulants.  Reports mild left-sided headache at this time.  Denies neck pain.  Denies weakness of his arms or legs.  No chest or abdominal pain.  No extremity pain.  No recent blood noted in his stool.  He has been eating and drinking normally.   Past Medical History:  Diagnosis Date  . Chicken pox   . Diverticulitis   . GERD (gastroesophageal reflux disease)   . H/O seasonal allergies   . Hay fever   . History of blood transfusion   . Hyperlipidemia   . Hypertension   . IBS (irritable bowel syndrome)   . Kidney stones   . Migraines     Patient Active Problem List   Diagnosis Date Noted  . Acute otitis externa 11/28/2014  . De Quervain's tenosynovitis, left 09/22/2014  . Gout 09/22/2014  . Encounter for allergy testing 09/22/2014  . 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 Eustachian tube dysfunction 05/12/2014  . Colon cancer screening 04/12/2014  . Visit for preventive health examination 03/08/2013  . GERD (gastroesophageal reflux disease) 03/08/2013  . Hypertension 03/08/2013  . Hyperlipidemia 03/08/2013    Past Surgical History:  Procedure Laterality Date  . Degloving of Arm Right 2007    [Post Injury]  . KIDNEY STONE SURGERY    . ROTATOR CUFF REPAIR  Right 2007  . SKIN GRAFT Right 2007   Arm Axillary        Home Medications    Prior to Admission medications   Medication Sig Start Date End Date Taking? Authorizing Provider  allopurinol (ZYLOPRIM) 100 MG tablet Take 100 mg by mouth daily. 10/21/17  Yes [provider]  gabapentin (NEURONTIN) 600 MG tablet Take 600 mg by mouth daily as needed (hand/wrist pain).  04/19/14  Yes [provider]  HYDROcodone-acetaminophen (NORCO/VICODIN) 5-325 MG per tablet Take 1 tablet by mouth daily as needed for moderate pain.  03/20/14  Yes [provider]  lisinopril-hydrochlorothiazide (PRINZIDE,ZESTORETIC) 20-12.5 MG per tablet Take 1 tablet by mouth daily. 04/12/14  Yes Waldon Merl, PA-C  lovastatin (MEVACOR) 20 MG tablet Take 1 tablet (20 mg total) by mouth at bedtime. 04/12/14  Yes Waldon Merl, PA-C  methocarbamol (ROBAXIN) 750 MG tablet Take 750 mg by mouth daily as needed.  04/25/14  Yes [provider]  metoprolol tartrate (LOPRESSOR) 25 MG tablet Take 1 tablet (25 mg total) by mouth 2 (two) times daily. Patient taking differently: Take 25 mg by mouth daily.  05/12/14  Yes Waldon Merl, PA-C  pantoprazole (PROTONIX) 40 MG tablet Take 1 tablet (40 mg total) by mouth daily. 04/12/14  Yes Waldon Merl, PA-C  SUMAtriptan (IMITREX) 100 MG tablet Take 100 mg by mouth as needed for  migraine.  04/28/14  Yes [provider]  meloxicam (MOBIC) 15 MG tablet Take 1 tablet (15 mg total) by mouth daily. Patient not taking: Reported on 11/21/2017 09/22/14   Waldon Merl, PA-C    Family History Family History  Problem Relation Age of Onset  . Breast cancer Mother 5       Deceased  . Heart disease Father 66       Deceased  . Stroke Father   . Hypertension Father   . Diabetes Father   . Irritable bowel syndrome Father   . Parkinson's disease Paternal Grandfather   . Healthy Brother        x1  . Healthy Sister        x1  . Irritable bowel  syndrome Brother   . Other Brother        septic shock    Social History Social History   Tobacco Use  . Smoking status: Never Smoker  . Smokeless tobacco: Never Used  Substance Use Topics  . Alcohol use: Yes    Comment: rare  . Drug use: No     Allergies   Erythromycin; Penicillins; and Tramadol   Review of Systems Review of Systems  All other systems reviewed and are negative.    Physical Exam Updated Vital Signs BP (!) 138/96   Pulse (!) 101   Temp 98.7 F (37.1 C) (Oral)   Resp 14   Ht 5\' 8"  (1.727 m)   Wt 79.4 kg   SpO2 100%   BMI 26.61 kg/m   Physical Exam  Constitutional: He is oriented to person, place, and time. He appears well-developed and well-nourished.  HENT:  Head: Normocephalic.  Nonbleeding laceration of the left forehead with some left periorbital ecchymosis without extraocular movement entrapment  Eyes: EOM are normal.  Neck: Normal range of motion.  Cardiovascular: Normal rate, regular rhythm, normal heart sounds and intact distal pulses.  Pulmonary/Chest: Effort normal and breath sounds normal. No respiratory distress.  Abdominal: Soft. He exhibits no distension. There is no tenderness.  Musculoskeletal: Normal range of motion.  Neurological: He is alert and oriented to person, place, and time.  Skin: Skin is warm and dry.  Psychiatric: He has a normal mood and affect. Judgment normal.  Nursing note and vitals reviewed.    ED Treatments / Results  Labs (all labs ordered are listed, but only abnormal results are displayed) Labs Reviewed  CBG MONITORING, ED - Abnormal; Notable for the following components:      Result Value   Glucose-Capillary 114 (*)    All other components within normal limits  I-STAT CHEM 8, ED - Abnormal; Notable for the following components:   Glucose, Bld 113 (*)    All other components within normal limits    EKG EKG Interpretation  Date/Time:  Saturday November 21 2017 06:58:09 EDT Ventricular  Rate:  115 PR Interval:    QRS Duration: 112 QT Interval:  345 QTC Calculation: 478 R Axis:   70 Text Interpretation:  Sinus tachycardia Borderline intraventricular conduction delay Borderline prolonged QT interval No old tracing to compare Confirmed by Azalia Bilis ([id number]) on 11/21/2017 9:53:00 AM   Radiology Ct Head Wo Contrast  Result Date: 11/21/2017 CLINICAL DATA:  Pain following fall with transient loss of consciousness EXAM: CT HEAD WITHOUT CONTRAST TECHNIQUE: Contiguous axial images were obtained from the base of the skull through the vertex without intravenous contrast. COMPARISON:  None. FINDINGS: Brain: The ventricles and sulci are felt to  be within normal limits for age. There is no intracranial mass, hemorrhage, extra-axial fluid collection, or midline shift. No intracranial parenchymal lesions evident. No evident acute infarct. Vascular: No hyperdense vessel. There is calcification in each carotid siphon and distal vertebral artery regions. Skull: Bony calvarium appears intact. There is overlying bandage at the left frontal level. There is a small scalp hematoma in this area. Sinuses/Orbits: There is mucosal thickening in several ethmoid air cells. Other paranasal sinuses are clear. Orbits appear symmetric bilaterally. Other: Mastoid air cells are clear. IMPRESSION: No mass or hemorrhage. Brain parenchyma appears unremarkable. No acute infarct evident. Small left frontal scalp hematoma. Overlying bandage in this area. There are foci of arterial vascular calcification. There is mucosal thickening in several ethmoid air cells. Electronically Signed   By: Bretta Bang III M.D.   On: 11/21/2017 07:40    Procedures .Marland KitchenLaceration Repair Performed by: Azalia Bilis, MD Authorized by: Azalia Bilis, MD     LACERATION REPAIR Performed by: Azalia Bilis Consent: Verbal consent obtained. Risks and benefits: risks, benefits and alternatives were discussed Patient identity confirmed:  provided demographic data Time out performed prior to procedure Prepped and Draped in normal sterile fashion Wound explored Laceration Location: left forehead  Laceration Length: 4cm No Foreign Bodies seen or palpated Anesthesia: local infiltration Local anesthetic: lidocaine 2% with epinephrine Anesthetic total: 5 ml Irrigation method: syringe Amount of cleaning: standard Skin closure: 4-0 prolene Number of sutures or staples: 5 Technique: running interlocked Patient tolerance: Patient tolerated the procedure well with no immediate complications.   Medications Ordered in ED Medications  lidocaine-EPINEPHrine (XYLOCAINE W/EPI) 2 %-1:200000 (PF) injection 20 mL (20 mLs Infiltration Given by Other 11/21/17 0830)  acetaminophen (TYLENOL) tablet 1,000 mg (1,000 mg Oral Given 11/21/17 0936)     Initial Impression / Assessment and Plan / ED Course  I have reviewed the triage vital signs and the nursing notes.  Pertinent labs & imaging results that were available during my care of the patient were reviewed by me and considered in my medical decision making (see chart for details).     CT imaging of the head without acute traumatic injury.  Laceration repaired.  Head injury and infection warnings given.  No use of anticoagulants.  C-spine nontender.  C-spine cleared by Nexus criteria.  Full range of motion of major upper and lower extremity joints.  Discharged home in good condition.  Suture removal in 5 days.  Patient and family encouraged to return to the emergency department for new or worsening symptoms  Final Clinical Impressions(s) / ED Diagnoses   Final diagnoses:  Acute head injury, initial encounter  Forehead laceration, initial encounter    ED Discharge Orders    None       Azalia Bilis, MD 11/21/17 [phone]

## 2018-04-05 ENCOUNTER — Emergency Department (HOSPITAL_BASED_OUTPATIENT_CLINIC_OR_DEPARTMENT_OTHER): Payer: BLUE CROSS/BLUE SHIELD

## 2018-04-05 ENCOUNTER — Emergency Department (HOSPITAL_BASED_OUTPATIENT_CLINIC_OR_DEPARTMENT_OTHER)
Admission: EM | Admit: 2018-04-05 | Discharge: 2018-04-06 | Disposition: A | Discharge status: Home / Self Care | Payer: BLUE CROSS/BLUE SHIELD | Attending: Emergency Medicine | Admitting: Emergency Medicine

## 2018-04-05 ENCOUNTER — Other Ambulatory Visit: Payer: Self-pay

## 2018-04-05 ENCOUNTER — Encounter (HOSPITAL_BASED_OUTPATIENT_CLINIC_OR_DEPARTMENT_OTHER): Payer: Self-pay | Admitting: *Deleted

## 2018-04-05 DIAGNOSIS — E876 Hypokalemia: Secondary | ICD-10-CM | POA: Insufficient documentation

## 2018-04-05 DIAGNOSIS — R002 Palpitations: Secondary | ICD-10-CM | POA: Diagnosis present

## 2018-04-05 DIAGNOSIS — R011 Cardiac murmur, unspecified: Secondary | ICD-10-CM | POA: Diagnosis not present

## 2018-04-05 DIAGNOSIS — Z79899 Other long term (current) drug therapy: Secondary | ICD-10-CM | POA: Diagnosis not present

## 2018-04-05 DIAGNOSIS — Z880 Allergy status to penicillin: Secondary | ICD-10-CM | POA: Insufficient documentation

## 2018-04-05 DIAGNOSIS — I10 Essential (primary) hypertension: Secondary | ICD-10-CM | POA: Diagnosis not present

## 2018-04-05 DIAGNOSIS — Z8249 Family history of ischemic heart disease and other diseases of the circulatory system: Secondary | ICD-10-CM | POA: Insufficient documentation

## 2018-04-05 LAB — COMPREHENSIVE METABOLIC PANEL
ALBUMIN: 4 g/dL (ref 3.5–5.0)
ALT: 22 U/L (ref 0–44)
AST: 23 U/L (ref 15–41)
Alkaline Phosphatase: 64 U/L (ref 38–126)
Anion gap: 9 (ref 5–15)
BILIRUBIN TOTAL: 1 mg/dL (ref 0.3–1.2)
BUN: 21 mg/dL — ABNORMAL HIGH (ref 6–20)
CO2: 24 mmol/L (ref 22–32)
Calcium: 8.8 mg/dL — ABNORMAL LOW (ref 8.9–10.3)
Chloride: 105 mmol/L (ref 98–111)
Creatinine, Ser: 1.11 mg/dL (ref 0.61–1.24)
GFR calc Af Amer: >60 mL/min (ref >60)
GFR calc non Af Amer: >60 mL/min (ref >60)
GLUCOSE: 142 mg/dL — AB (ref 70–99)
POTASSIUM: 2.6 mmol/L — AB (ref 3.5–5.1)
Sodium: 138 mmol/L (ref 135–145)
TOTAL PROTEIN: 6.6 g/dL (ref 6.5–8.1)

## 2018-04-05 LAB — CBC WITH DIFFERENTIAL/PLATELET
Abs Immature Granulocytes: 0.02 10*3/uL (ref 0.00–0.07)
BASOS ABS: 0.1 10*3/uL (ref 0.0–0.1)
Basophils Relative: 1 %
EOS ABS: 0.2 10*3/uL (ref 0.0–0.5)
Eosinophils Relative: 2 %
HEMATOCRIT: 38.5 % — AB (ref 39.0–52.0)
Hemoglobin: 13.2 g/dL (ref 13.0–17.0)
Immature Granulocytes: 0 %
LYMPHS ABS: 2.2 10*3/uL (ref 0.7–4.0)
Lymphocytes Relative: 26 %
MCH: 30.8 pg (ref 26.0–34.0)
MCHC: 34.3 g/dL (ref 30.0–36.0)
MCV: 90 fL (ref 80.0–100.0)
Monocytes Absolute: 0.7 10*3/uL (ref 0.1–1.0)
Monocytes Relative: 8 %
NRBC: 0 % (ref 0.0–0.2)
Neutro Abs: 5.2 10*3/uL (ref 1.7–7.7)
Neutrophils Relative %: 63 %
Platelets: 289 10*3/uL (ref 150–400)
RBC: 4.28 MIL/uL (ref 4.22–5.81)
RDW: 12.3 % (ref 11.5–15.5)
WBC: 8.4 10*3/uL (ref 4.0–10.5)

## 2018-04-05 LAB — TROPONIN I

## 2018-04-05 MED ORDER — POTASSIUM CHLORIDE 10 MEQ/100ML IV SOLN
10.0000 meq | INTRAVENOUS | Status: AC
  Administered 2018-04-05 – 2018-04-06 (×2): 10 meq via INTRAVENOUS
  Filled 2018-04-05 (×2): qty 100

## 2018-04-05 MED ORDER — SODIUM CHLORIDE 0.9 % IV BOLUS
1000.0000 mL | Freq: Once | INTRAVENOUS | Status: AC
  Administered 2018-04-05: 1000 mL via INTRAVENOUS

## 2018-04-05 MED ORDER — IOPAMIDOL (ISOVUE-370) INJECTION 76%
100.0000 mL | Freq: Once | INTRAVENOUS | Status: AC | PRN
  Administered 2018-04-05: 100 mL via INTRAVENOUS

## 2018-04-05 NOTE — ED Notes (Signed)
Patient transported to CT 

## 2018-04-05 NOTE — ED Provider Notes (Signed)
[facility] EMERGENCY DEPARTMENT Provider Note   CSN: [id number] Arrival date & time: 04/05/18  1902     History   Chief Complaint Chief Complaint  Patient presents with  . Palpitations    HPI Aaron Macias is a 58 y.o. male.  HPI  Presents with paliptations Was having palpitations, feels like 2 pieces of metal rubbing together, was a little fast, regular If gets up and moves, feels lightheaded This time was going down the hall, and felt lightheaded When over the dizzy spell felt palpitations still Has done it before but not as hard as today Was seeing cardiologist last Monday and didn't tell him about these symptoms.  Told to cut the metoprolol 2 tablets twice a day (wasn't taken 2 anyway) and told to cut 25mg  in half. Then lightheadedness continued and now having increase of palpitations, like "pumping a dry well"  Today episode started around 630PM, just put in 2 week notice for work.  Does feel it right now.   Dad had CAD    Past Medical History:  Diagnosis Date  . Chicken pox   . Diverticulitis   . GERD (gastroesophageal reflux disease)   . H/O seasonal allergies   . Hay fever   . History of blood transfusion   . Hyperlipidemia   . Hypertension   . IBS (irritable bowel syndrome)   . Kidney stones   . Migraines     Patient Active Problem List   Diagnosis Date Noted  . Acute otitis externa 11/28/2014  . De Quervain's tenosynovitis, left 09/22/2014  . Gout 09/22/2014  . Encounter for allergy testing 09/22/2014  . 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 Eustachian tube dysfunction 05/12/2014  . Colon cancer screening 04/12/2014  . Visit for preventive health examination 03/08/2013  . GERD (gastroesophageal reflux disease) 03/08/2013  . Hypertension 03/08/2013  . Hyperlipidemia 03/08/2013    Past Surgical History:  Procedure Laterality Date  . Degloving of Arm Right 2007    [Post Injury]  . KIDNEY STONE SURGERY    . ROTATOR CUFF REPAIR Right  2007  . SKIN GRAFT Right 2007   Arm Axillary        Home Medications    Prior to Admission medications   Medication Sig Start Date End Date Taking? Authorizing Provider  allopurinol (ZYLOPRIM) 100 MG tablet Take 100 mg by mouth daily. 10/21/17   [provider]  gabapentin (NEURONTIN) 600 MG tablet Take 600 mg by mouth daily as needed (hand/wrist pain).  04/19/14   [provider]  HYDROcodone-acetaminophen (NORCO/VICODIN) 5-325 MG per tablet Take 1 tablet by mouth daily as needed for moderate pain.  03/20/14   [provider]  lisinopril-hydrochlorothiazide (PRINZIDE,ZESTORETIC) 20-12.5 MG per tablet Take 1 tablet by mouth daily. 04/12/14   Waldon MerlMartin, William C, PA-C  lovastatin (MEVACOR) 20 MG tablet Take 1 tablet (20 mg total) by mouth at bedtime. 04/12/14   Waldon MerlMartin, William C, PA-C  meloxicam (MOBIC) 15 MG tablet Take 1 tablet (15 mg total) by mouth daily. Patient not taking: Reported on 11/21/2017 09/22/14   Waldon MerlMartin, William C, PA-C  methocarbamol (ROBAXIN) 750 MG tablet Take 750 mg by mouth daily as needed.  04/25/14   [provider]  metoprolol tartrate (LOPRESSOR) 25 MG tablet Take 1 tablet (25 mg total) by mouth 2 (two) times daily. Patient taking differently: Take 25 mg by mouth daily.  05/12/14   Waldon MerlMartin, William C, PA-C  pantoprazole (PROTONIX) 40 MG tablet Take 1 tablet (40 mg total)  by mouth daily. 04/12/14   Waldon MerlMartin, William C, PA-C  potassium chloride SA (K-DUR,KLOR-CON) 20 MEQ tablet Take 1 tablet (20 mEq total) by mouth 2 (two) times daily for 4 days. 04/06/18 04/10/18  Alvira MondaySchlossman, Ryker [place], MD  SUMAtriptan (IMITREX) 100 MG tablet Take 100 mg by mouth as needed for migraine.  04/28/14   [provider]    Family History Family History  Problem Relation Age of Onset  . Breast cancer Mother [id number]       Deceased  . Heart disease Father [id number]       Deceased  . Stroke Father   . Hypertension Father   . Diabetes Father   . Irritable bowel syndrome Father    . Parkinson's disease Paternal Grandfather   . Healthy Brother        x1  . Healthy Sister        x1  . Irritable bowel syndrome Brother   . Other Brother        septic shock    Social History Social History   Tobacco Use  . Smoking status: Never Smoker  . Smokeless tobacco: Never Used  Substance Use Topics  . Alcohol use: Yes    Comment: rare  . Drug use: No     Allergies   Erythromycin; Penicillins; and Tramadol   Review of Systems Review of Systems  Constitutional: Positive for fatigue. Negative for fever.  HENT: Negative for sore throat.   Eyes: Negative for visual disturbance.  Respiratory: Negative for shortness of breath.   Cardiovascular: Positive for palpitations. Negative for chest pain.  Gastrointestinal: Negative for abdominal pain, nausea and vomiting.  Genitourinary: Negative for difficulty urinating.  Musculoskeletal: Positive for back pain ("bulbble like sensation"). Negative for neck stiffness.  Skin: Negative for rash.  Neurological: Positive for light-headedness. Negative for syncope and headaches.     Physical Exam Updated Vital Signs BP 120/83   Pulse 91   Temp 98.2 F (36.8 C) (Oral)   Resp 18   Ht 5\' 8"  (1.727 m)   Wt 79.4 kg   SpO2 98%   BMI 26.61 kg/m   Physical Exam Vitals signs and nursing note reviewed.  Constitutional:      General: He is not in acute distress.    Appearance: He is well-developed. He is not diaphoretic.  HENT:     Head: Normocephalic and atraumatic.  Eyes:     Conjunctiva/sclera: Conjunctivae normal.  Neck:     Musculoskeletal: Normal range of motion.  Cardiovascular:     Rate and Rhythm: Normal rate and regular rhythm.     Heart sounds: Murmur present. Systolic murmur present. No friction rub. No gallop.   Pulmonary:     Effort: Pulmonary effort is normal. No respiratory distress.     Breath sounds: Normal breath sounds. No wheezing or rales.  Abdominal:     General: There is no distension.      Palpations: Abdomen is soft.     Tenderness: There is no abdominal tenderness. There is no guarding.  Skin:    General: Skin is warm and dry.  Neurological:     Mental Status: He is alert and oriented to person, place, and time.      ED Treatments / Results  Labs (all labs ordered are listed, but only abnormal results are displayed) Labs Reviewed  CBC WITH DIFFERENTIAL/PLATELET - Abnormal; Notable for the following components:      Result Value   HCT 38.5 (*)  All other components within normal limits  COMPREHENSIVE METABOLIC PANEL - Abnormal; Notable for the following components:   Potassium 2.6 (*)    Glucose, Bld 142 (*)    BUN 21 (*)    Calcium 8.8 (*)    All other components within normal limits  TROPONIN I    EKG EKG Interpretation  Date/Time:  Monday April 05 2018 19:30:25 EST Ventricular Rate:  115 PR Interval:  156 QRS Duration: 106 QT Interval:  338 QTC Calculation: 467 R Axis:   52 Text Interpretation:  Sinus tachycardia Nonspecific ST abnormality Abnormal ECG No significant change since last tracing Confirmed by Alvira Monday ([id number]) on 04/05/2018 8:07:34 PM   Radiology Dg Chest 2 View  Result Date: 04/05/2018 CLINICAL DATA:  Palpitations for 1 month with dizziness, initial encounter EXAM: CHEST - 2 VIEW COMPARISON:  None. FINDINGS: The heart size and mediastinal contours are within normal limits. Both lungs are clear. The visualized skeletal structures are unremarkable. IMPRESSION: No active cardiopulmonary disease. Electronically Signed   By: Alcide Clever M.D.   On: 04/05/2018 21:30   Ct Angio Chest/abd/pel For Dissection W And/or Wo Contrast  Result Date: 04/05/2018 CLINICAL DATA:  58 y/o M; Chest/back pain, acute, aortic dissection suspect. EXAM: CT ANGIOGRAPHY CHEST, ABDOMEN AND PELVIS TECHNIQUE: Multidetector CT imaging through the chest, abdomen and pelvis was performed using the standard protocol during bolus administration of intravenous  contrast. Multiplanar reconstructed images and MIPs were obtained and reviewed to evaluate the vascular anatomy. CONTRAST:  ISOVUE-370 IOPAMIDOL (ISOVUE-370) INJECTION 76% COMPARISON:  04/05/2018 chest radiograph FINDINGS: CTA CHEST FINDINGS Cardiovascular: Satisfactory opacification of the pulmonary arteries to the segmental level. No evidence of pulmonary embolism. Normal heart size. No pericardial effusion. Mediastinum/Nodes: No enlarged mediastinal, hilar, or axillary lymph nodes. Thyroid gland, trachea, and esophagus demonstrate no significant findings. Lungs/Pleura: Scattered perifissural 2-3 mm nodules compatible with intrapulmonary lymph nodes. No consolidation, effusion, or pneumothorax. Musculoskeletal: No chest wall abnormality. No acute or significant osseous findings. Mild thoracolumbar junction spinal dextrocurvature. Review of the MIP images confirms the above findings. CTA ABDOMEN AND PELVIS FINDINGS VASCULAR Aorta: Normal caliber aorta without aneurysm, dissection, vasculitis or significant stenosis. Celiac: Patent without evidence of aneurysm, dissection, vasculitis or significant stenosis. SMA: Patent without evidence of aneurysm, dissection, vasculitis or significant stenosis. Renals: Both renal arteries are patent without evidence of aneurysm, dissection, vasculitis, fibromuscular dysplasia or significant stenosis. IMA: Patent without evidence of aneurysm, dissection, vasculitis or significant stenosis. Inflow: Patent without evidence of aneurysm, dissection, vasculitis or significant stenosis. Veins: No obvious venous abnormality within the limitations of this arterial phase study. Review of the MIP images confirms the above findings. NON-VASCULAR Hepatobiliary: No focal liver abnormality is seen. No gallstones, gallbladder wall thickening, or biliary dilatation. Pancreas: Unremarkable. No pancreatic ductal dilatation or surrounding inflammatory changes. Spleen: Normal in size without  focal abnormality. 11 mm splenule anterior to the spleen. Adrenals/Urinary Tract: Adrenal glands are unremarkable. Subcentimeter cyst in lower pole of left kidney. Otherwise kidneys are normal, without renal calculi, focal lesion, or hydronephrosis. Bladder is unremarkable. Stomach/Bowel: Stomach is within normal limits. Appendix appears normal. No evidence of bowel wall thickening, distention, or inflammatory changes. Mild sigmoid diverticulosis without findings of acute diverticulitis. Lymphatic: Aortic atherosclerosis. No enlarged abdominal or pelvic lymph nodes. Reproductive: Negative. Other: No abdominal wall hernia or abnormality. No abdominopelvic ascites. Musculoskeletal: No acute or significant osseous findings. Review of the MIP images confirms the above findings. IMPRESSION: 1. No aortic aneurysm, dissection, vasculitis or significant stenosis. Aortic  Atherosclerosis (ICD10-I70.0). 2. No acute process identified. 3. Mild sigmoid diverticulosis without findings of acute diverticulitis. Electronically Signed   By: Mitzi Hansen M.D.   On: 04/05/2018 23:26    Procedures Procedures (including critical care time)  Medications Ordered in ED Medications  potassium chloride 10 mEq in 100 mL IVPB (0 mEq Intravenous Stopped 04/06/18 0135)  magnesium sulfate IVPB 2 g 50 mL (2 g Intravenous New Bag/Given 04/06/18 0135)  iopamidol (ISOVUE-370) 76 % injection 100 mL (100 mLs Intravenous Contrast Given 04/05/18 2255)  sodium chloride 0.9 % bolus 1,000 mL (0 mLs Intravenous Stopped 04/06/18 0017)  potassium chloride SA (K-DUR,KLOR-CON) CR tablet 40 mEq (40 mEq Oral Given 04/06/18 0021)     Initial Impression / Assessment and Plan / ED Course  I have reviewed the triage vital signs and the nursing notes.  Pertinent labs & imaging results that were available during my care of the patient were reviewed by me and considered in my medical decision making (see chart for details).    58 year old male  with history of htn, hlpd, presents with concern for lightheadedness and palpitations.  Recently saw Cardiologist for lightheadedness and had metoprolol dose decreased.    Blood pressures in ED noted on arrival to fluctuate, and given concern for blood pressures, some sensation in back, dissection study ordered which showed no acute abnormalities.  Hypokalemia noted to 2.6.  Given K and Mg replacement and IV fluids. Patient feels improved. HR decreased and blood pressures increased. Feel dehydration likely contributor to symptoms.  New murmur noted on exam. No fevers. Do not see need at this time for continued inpatient care, however recommend close follow up with PCP for hypokalemia and with Cardiology for murmur and palpitations.  Final Clinical Impressions(s) / ED Diagnoses   Final diagnoses:  Palpitations  Murmur  Hypokalemia    ED Discharge Orders         Ordered    potassium chloride SA (K-DUR,KLOR-CON) 20 MEQ tablet  2 times daily     04/06/18 0107           Alvira Monday, MD 04/06/18 [phone]

## 2018-04-05 NOTE — ED Triage Notes (Signed)
Palpitations tonight. Hx of same off and on for a month. He saw a cardiologist for dizziness and was told to half his BP medication.

## 2018-04-05 NOTE — ED Notes (Signed)
Date and time results received: 04/05/18   Critical Value: Potassium 2.6  Name of Provider Notified:Schlossman

## 2018-04-05 NOTE — ED Notes (Signed)
ED Provider at bedside. 

## 2018-04-06 MED ORDER — MAGNESIUM SULFATE 2 GM/50ML IV SOLN
2.0000 g | Freq: Once | INTRAVENOUS | Status: AC
  Administered 2018-04-06: 2 g via INTRAVENOUS
  Filled 2018-04-06: qty 50

## 2018-04-06 MED ORDER — POTASSIUM CHLORIDE CRYS ER 20 MEQ PO TBCR: 20.0000 meq | EXTENDED_RELEASE_TABLET | Freq: Two times a day (BID) | ORAL | 0 refills | Status: AC

## 2018-04-06 MED ORDER — POTASSIUM CHLORIDE CRYS ER 20 MEQ PO TBCR
40.0000 meq | EXTENDED_RELEASE_TABLET | Freq: Once | ORAL | Status: AC
  Administered 2018-04-06: 40 meq via ORAL
  Filled 2018-04-06: qty 2

## 2018-04-06 NOTE — ED Notes (Signed)
ED Provider at bedside. 

## 2018-04-06 NOTE — ED Notes (Signed)
Per MD patient to receive only 2 runs of K and PO K.

## 2020-03-27 DEATH — deceased

## 2020-06-13 IMAGING — CT CT ANGIO CHEST-ABD-PELV FOR DISSECTION W/ AND WO/W CM
2 of 9 series · 14 of 46 positions shown, 16 images · IV contrast (APPLIED)
Comparison: 04/05/2018 chest radiograph

CLINICAL DATA: 57 y/o M; Chest/back pain, acute, aortic dissection
suspect.

EXAM:
CT ANGIOGRAPHY CHEST, ABDOMEN AND PELVIS
TECHNIQUE: Multidetector CT imaging through the chest, abdomen and pelvis was
performed using the standard protocol during bolus administration of
intravenous contrast. Multiplanar reconstructed images and MIPs were
obtained and reviewed to evaluate the vascular anatomy.
CONTRAST:  100mL [id number]-KMF IOPAMIDOL ([id number]-KMF) INJECTION 76%

[Series 5: axial arterial · axial · arterial · 0.91mm/px · z∈[+483,+1059]mm · 11 of 216 slices shown, 13 images]
[im 12/216  soft-tissue]
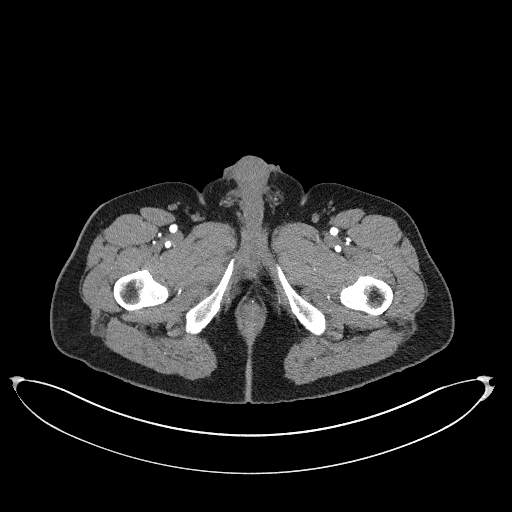
[im 12/216  bone]
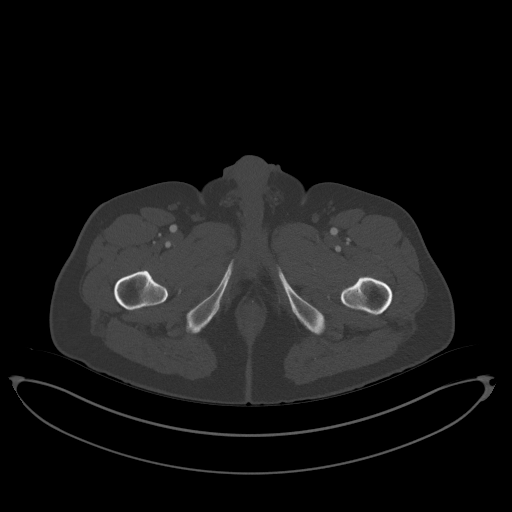
[im 36/216  soft-tissue]
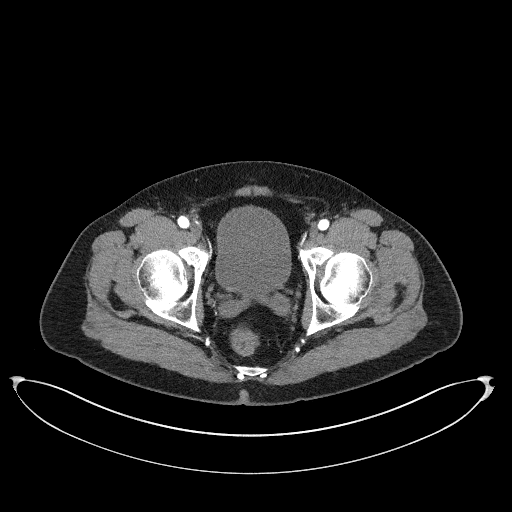
[im 48/216  soft-tissue]
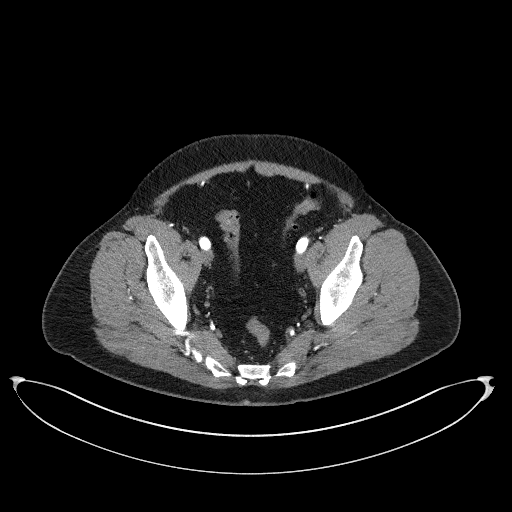
[im 72/216  soft-tissue]
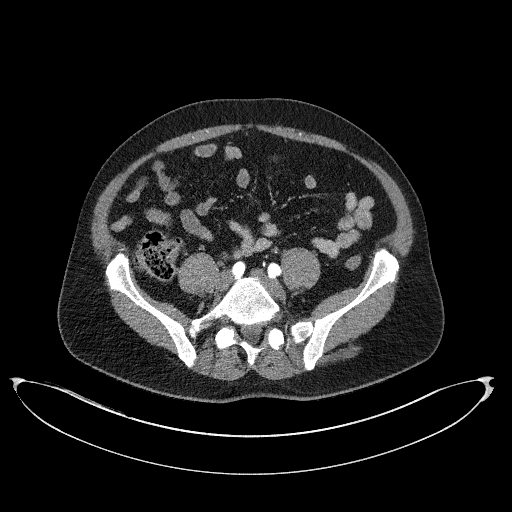
[im 84/216  soft-tissue]
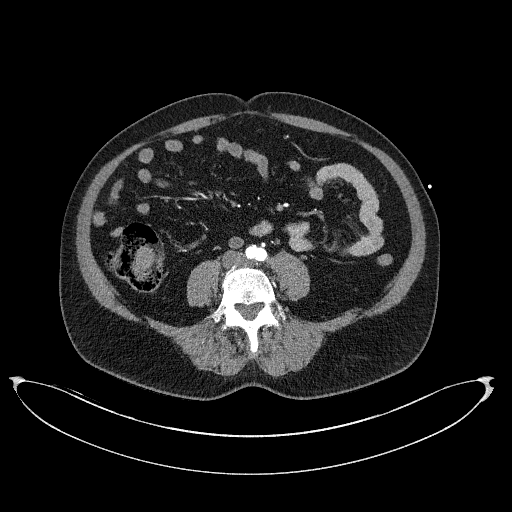
[im 108/216  soft-tissue]
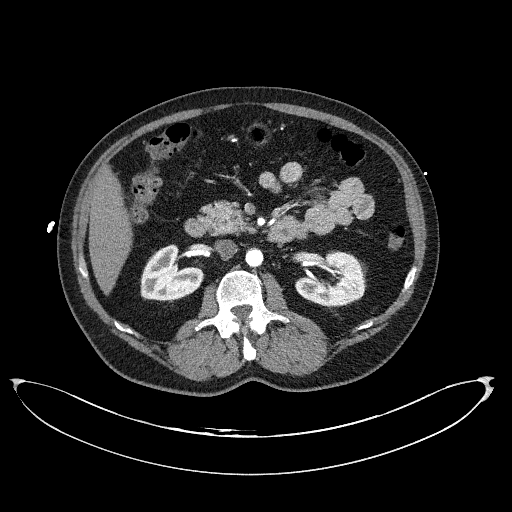
[im 132/216  soft-tissue]
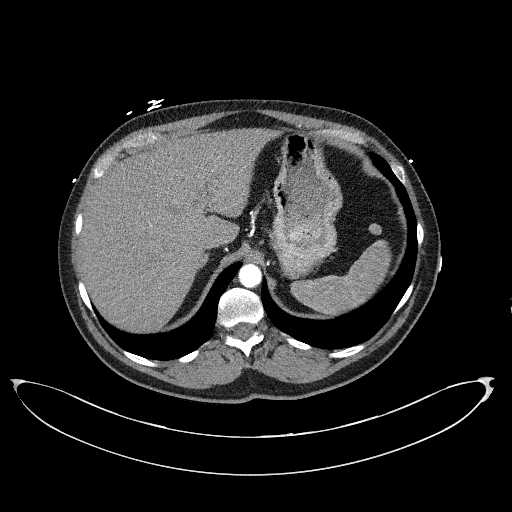
[im 144/216  soft-tissue]
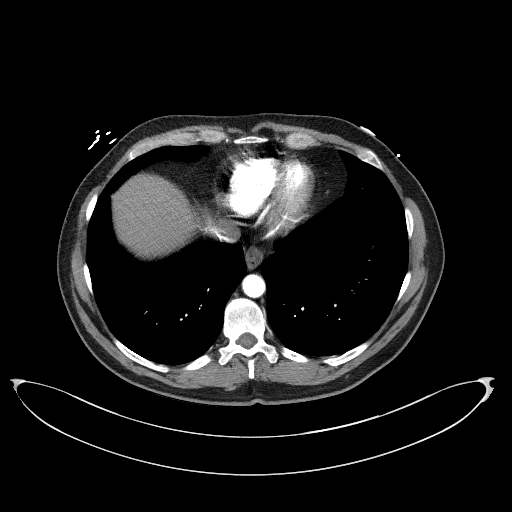
[im 168/216  soft-tissue]
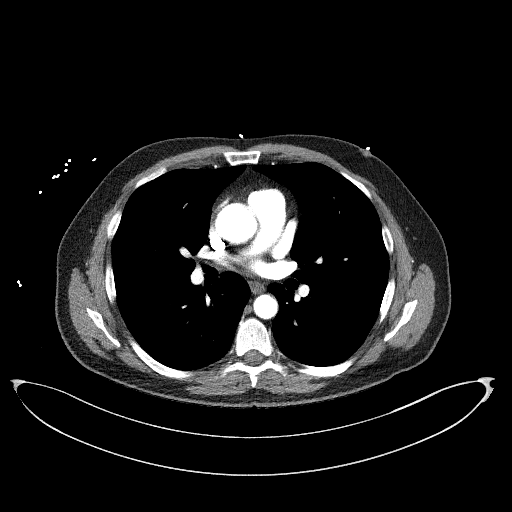
[im 168/216  bone]
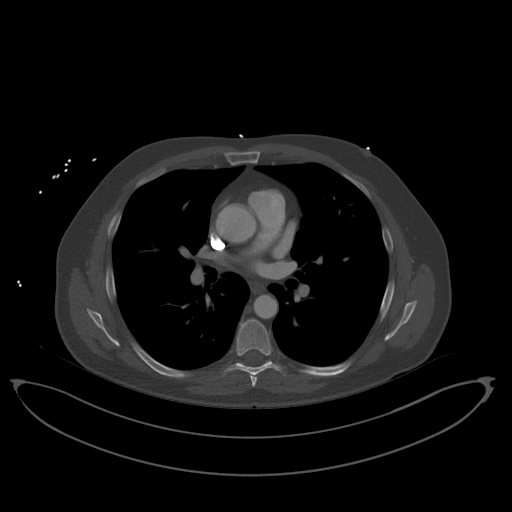
[im 180/216  soft-tissue]
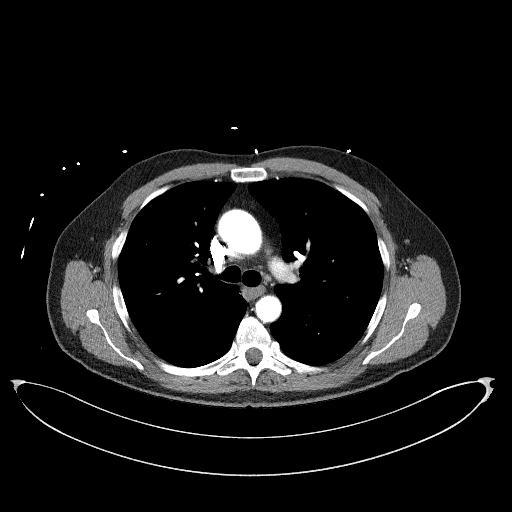
[im 204/216  soft-tissue]
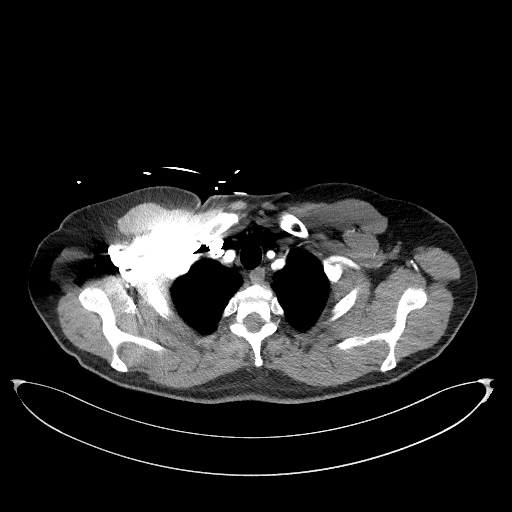

[Series 8: coronals · coronal · 0.86mm/px · 3 of 144 slices shown]
[im 36/144  soft-tissue]
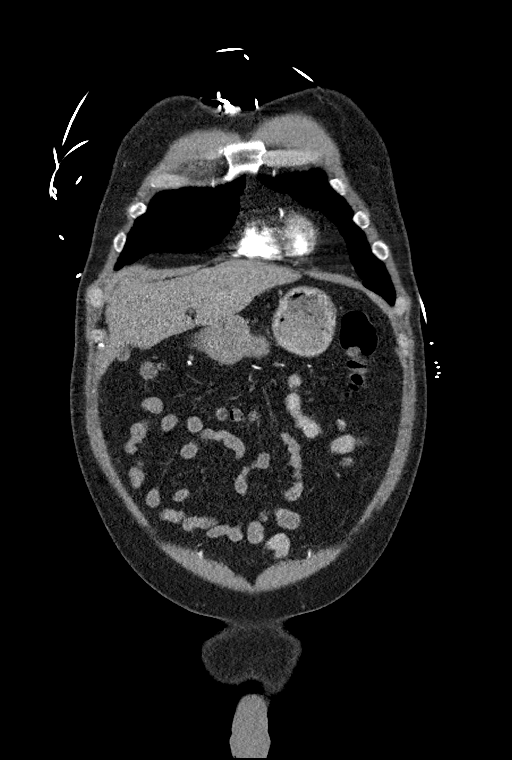
[im 72/144  soft-tissue]
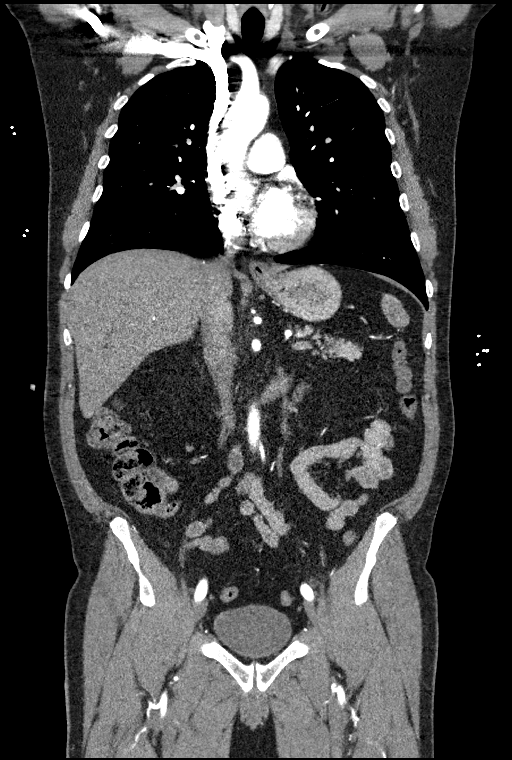
[im 108/144  soft-tissue]
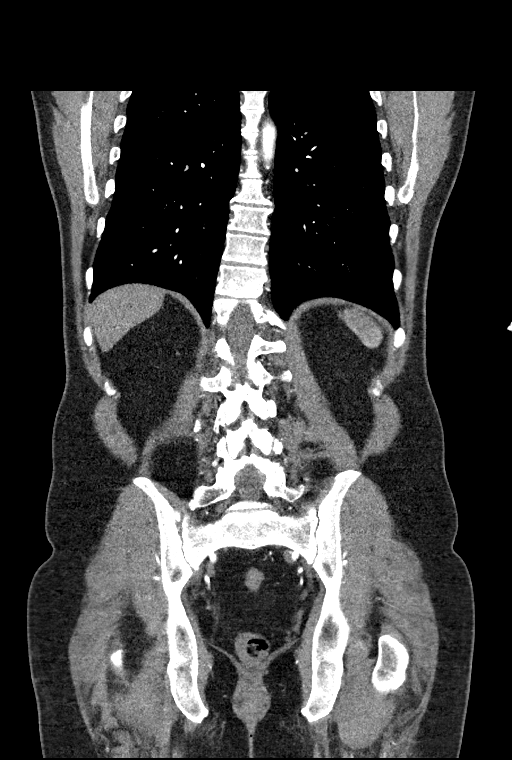

[14 of 46 positions shown; findings below may reference images not displayed]

FINDINGS: CTA CHEST FINDINGS

Cardiovascular: Satisfactory opacification of the pulmonary arteries
to the segmental level. No evidence of pulmonary embolism. Normal
heart size. No pericardial effusion.

Mediastinum/Nodes: No enlarged mediastinal, hilar, or axillary lymph
nodes. Thyroid gland, trachea, and esophagus demonstrate no
significant findings.

Lungs/Pleura: Scattered perifissural 2-3 mm nodules compatible with
intrapulmonary lymph nodes. No consolidation, effusion, or
pneumothorax.

Musculoskeletal: No chest wall abnormality. No acute or significant
osseous findings. Mild thoracolumbar junction spinal
dextrocurvature.

Review of the MIP images confirms the above findings.

CTA ABDOMEN AND PELVIS FINDINGS

VASCULAR

Aorta: Normal caliber aorta without aneurysm, dissection, vasculitis
or significant stenosis.

Celiac: Patent without evidence of aneurysm, dissection, vasculitis
or significant stenosis.

SMA: Patent without evidence of aneurysm, dissection, vasculitis or
significant stenosis.

Renals: Both renal arteries are patent without evidence of aneurysm,
dissection, vasculitis, fibromuscular dysplasia or significant
stenosis.

IMA: Patent without evidence of aneurysm, dissection, vasculitis or
significant stenosis.

Inflow: Patent without evidence of aneurysm, dissection, vasculitis
or significant stenosis.

Veins: No obvious venous abnormality within the limitations of this
arterial phase study.

Review of the MIP images confirms the above findings.

NON-VASCULAR

Hepatobiliary: No focal liver abnormality is seen. No gallstones,
gallbladder wall thickening, or biliary dilatation.

Pancreas: Unremarkable. No pancreatic ductal dilatation or
surrounding inflammatory changes.

Spleen: Normal in size without focal abnormality. 11 mm splenule
anterior to the spleen.

Adrenals/Urinary Tract: Adrenal glands are unremarkable.
Subcentimeter cyst in lower pole of left kidney. Otherwise kidneys
are normal, without renal calculi, focal lesion, or hydronephrosis.
Bladder is unremarkable.

Stomach/Bowel: Stomach is within normal limits. Appendix appears
normal. No evidence of bowel wall thickening, distention, or
inflammatory changes. Mild sigmoid diverticulosis without findings
of acute diverticulitis.

Lymphatic: Aortic atherosclerosis. No enlarged abdominal or pelvic
lymph nodes.

Reproductive: Negative.

Other: No abdominal wall hernia or abnormality. No abdominopelvic
ascites.

Musculoskeletal: No acute or significant osseous findings.

Review of the MIP images confirms the above findings.
IMPRESSION: 1. No aortic aneurysm, dissection, vasculitis or significant
stenosis. Aortic Atherosclerosis ([id number]).
2. No acute process identified.
3. Mild sigmoid diverticulosis without findings of acute
diverticulitis.
